# Patient Record
Sex: Male | Born: 1955 | Hispanic: Refuse to answer | Marital: Married | Smoking: Former smoker
Health system: Southern US, Community
[De-identification: ages and names within clinical notes are randomized; demographics above are authoritative.]

## PROBLEM LIST (undated history)

## (undated) DIAGNOSIS — C61 Malignant neoplasm of prostate: Principal | ICD-10-CM

## (undated) DIAGNOSIS — R6882 Decreased libido: Secondary | ICD-10-CM

## (undated) DIAGNOSIS — N529 Male erectile dysfunction, unspecified: Secondary | ICD-10-CM

## (undated) DIAGNOSIS — I82409 Acute embolism and thrombosis of unspecified deep veins of unspecified lower extremity: Secondary | ICD-10-CM

## (undated) DIAGNOSIS — R972 Elevated prostate specific antigen [PSA]: Secondary | ICD-10-CM

## (undated) DIAGNOSIS — E7800 Pure hypercholesterolemia, unspecified: Secondary | ICD-10-CM

## (undated) HISTORY — DX: Male erectile dysfunction, unspecified: N52.9

## (undated) HISTORY — DX: Malignant neoplasm of prostate: C61

## (undated) HISTORY — DX: Decreased libido: R68.82

## (undated) HISTORY — DX: Elevated prostate specific antigen (PSA): R97.20

## (undated) HISTORY — DX: Pure hypercholesterolemia, unspecified: E78.00

---

## 2002-03-24 ENCOUNTER — Encounter: Payer: Self-pay | Admitting: Emergency Medicine

## 2002-03-24 ENCOUNTER — Emergency Department (HOSPITAL_COMMUNITY): Admission: EM | Admit: 2002-03-24 | Discharge: 2002-03-24 | Payer: Self-pay | Admitting: Plastic Surgery

## 2004-05-19 ENCOUNTER — Emergency Department: Payer: Self-pay | Admitting: Unknown Physician Specialty

## 2005-07-12 ENCOUNTER — Ambulatory Visit: Payer: Self-pay | Admitting: Family Medicine

## 2006-06-15 ENCOUNTER — Ambulatory Visit: Payer: Self-pay | Admitting: Family Medicine

## 2008-07-16 DIAGNOSIS — R6882 Decreased libido: Secondary | ICD-10-CM | POA: Insufficient documentation

## 2009-03-11 ENCOUNTER — Emergency Department: Payer: Self-pay | Admitting: Emergency Medicine

## 2010-03-26 DIAGNOSIS — E785 Hyperlipidemia, unspecified: Secondary | ICD-10-CM | POA: Insufficient documentation

## 2011-06-28 HISTORY — PX: HERNIA REPAIR: SHX51

## 2012-03-05 ENCOUNTER — Ambulatory Visit: Payer: Self-pay

## 2012-03-26 ENCOUNTER — Ambulatory Visit: Payer: Self-pay | Admitting: General Surgery

## 2012-06-27 HISTORY — PX: COLONOSCOPY: SHX174

## 2013-06-27 DIAGNOSIS — C61 Malignant neoplasm of prostate: Secondary | ICD-10-CM

## 2013-06-27 HISTORY — DX: Malignant neoplasm of prostate: C61

## 2013-08-26 ENCOUNTER — Ambulatory Visit: Payer: Self-pay | Admitting: Gastroenterology

## 2013-10-17 ENCOUNTER — Ambulatory Visit: Payer: Self-pay | Admitting: Urology

## 2013-12-06 LAB — LIPID PANEL: CHOLESTEROL: 242 mg/dL — AB (ref 0–200)

## 2014-02-19 ENCOUNTER — Other Ambulatory Visit: Payer: Self-pay | Admitting: *Deleted

## 2014-02-19 ENCOUNTER — Ambulatory Visit: Payer: 59 | Admitting: Podiatry

## 2014-02-19 ENCOUNTER — Encounter: Payer: Self-pay | Admitting: Podiatry

## 2014-02-19 ENCOUNTER — Ambulatory Visit (INDEPENDENT_AMBULATORY_CARE_PROVIDER_SITE_OTHER): Payer: 59 | Admitting: Podiatry

## 2014-02-19 ENCOUNTER — Ambulatory Visit (INDEPENDENT_AMBULATORY_CARE_PROVIDER_SITE_OTHER): Payer: 59

## 2014-02-19 VITALS — Ht 68.0 in | Wt 190.0 lb

## 2014-02-19 DIAGNOSIS — M722 Plantar fascial fibromatosis: Secondary | ICD-10-CM

## 2014-02-19 NOTE — Progress Notes (Signed)
   Subjective:    Patient ID: Brandon Diaz, male    DOB: 1955/09/24, 58 y.o.   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 Been having heel pain for about 5 weeks and not getting any better. By the end of the day it swells and real painful.  I am hobbling in the morning. Been keeping my tennis shoes by the bed. I am having a biopsy done this afternoon at 4.   Foot Pain      Review of Systems  Eyes: Positive for redness.  All other systems reviewed and are negative.      Objective:   Physical Exam: I have reviewed his past mental history medications allergies surgeries social history and review of systems. Pulses are strongly palpable right. Neurologic sensorium is intact per Semmes-Weinstein monofilament. Deep tendon reflexes are intact bilateral muscle strength is 5 over 5 dorsiflexors plantar flexors inverters everters all intrinsic musculature is intact. Orthopedic evaluation demonstrates pain on palpation medial continued tubercle of the right heel. Flexible pes planus is also noted bilateral. Radiographic evaluation demonstrates a small plantar distally oriented calcaneal heel spur with soft tissue increase in density at the plantar fascial calcaneal insertion site.        Assessment & Plan:  Assessment: Pes planus a plantar fasciitis right foot.  Plan: I will followup with him in 2 weeks to get him started on an injection plan and oral steroids. He is having a prostate biopsy and should not have steroid therapy at this time. I did dispense a plantar fascial brace and a night splint and discussed appropriate shoe gear stretching exercises ice therapy and shoe gear modification. Followup with him in 2 weeks

## 2014-02-19 NOTE — Patient Instructions (Signed)

## 2014-03-05 ENCOUNTER — Ambulatory Visit: Payer: 59 | Admitting: Podiatry

## 2014-04-07 ENCOUNTER — Ambulatory Visit: Payer: 59 | Admitting: Podiatry

## 2014-08-15 LAB — PSA: PSA: 5.7

## 2014-10-14 NOTE — Op Note (Signed)
PATIENT NAME:  Brandon Diaz, Brandon Diaz MR#:  [id number] DATE OF BIRTH:  01-28-1956  DATE OF PROCEDURE:  03/26/2012  PREOPERATIVE DIAGNOSIS: Umbilical hernia.   POSTOPERATIVE DIAGNOSIS: Umbilical hernia.   OPERATIVE PROCEDURE: Umbilical hernia repair.   SURGEON: Hervey Ard, MD  ANESTHESIA: General by LMA, Marcaine 0.5% plain 30 mL local infiltration.   ESTIMATED BLOOD LOSS: Minimal.   CLINICAL NOTE: This 59 year old male has developed a symptomatic umbilical hernia and was felt to be a candidate for elective repair.   DESCRIPTION OF PROCEDURE: With the patient under adequate general anesthesia, the area was prepped with ChloraPrep and draped. Field block anesthesia was established and well tolerated. An infraumbilical incision was made in the skin and subcutaneous tissue divided sharply followed by dissection with scissors and electrocautery. The umbilical skin was elevated off the hernia sac which was excised and discarded. The fascia was cleared. The undersurface of the fascia was cleared and primary repair with interrupted 0 Surgilon sutures completed. The umbilical skin was tacked to the fascia with 2-0 Vicryl. The adipose layer was approximated with a running 2-0 Vicryl and the skin closed with a running 4-0 Vicryl subcuticular suture. Benzoin, Steri-Strips, Telfa, and Tegaderm dressing was then applied.   The patient tolerated the procedure well and was taken to the recovery room in stable condition. ____________________________ Robert Bellow, MD jwb:slb D: 03/26/2012 21:47:31 ET     T: 03/27/2012 09:40:14 ET        JOB#: [id number] cc: Robert Bellow, MD, <Dictator> Ashok Norris, MD Chayah Mckee Amedeo Kinsman MD ELECTRONICALLY SIGNED 03/28/2012 22:15

## 2014-12-01 ENCOUNTER — Encounter (INDEPENDENT_AMBULATORY_CARE_PROVIDER_SITE_OTHER): Payer: Self-pay

## 2014-12-01 ENCOUNTER — Encounter: Payer: Self-pay | Admitting: Family Medicine

## 2014-12-01 ENCOUNTER — Ambulatory Visit (INDEPENDENT_AMBULATORY_CARE_PROVIDER_SITE_OTHER): Payer: 59 | Admitting: Family Medicine

## 2014-12-01 VITALS — BP 128/74 | HR 74 | Temp 98.4°F | Resp 18 | Ht 67.0 in | Wt 194.0 lb

## 2014-12-01 DIAGNOSIS — C61 Malignant neoplasm of prostate: Secondary | ICD-10-CM

## 2014-12-01 DIAGNOSIS — E785 Hyperlipidemia, unspecified: Secondary | ICD-10-CM

## 2014-12-01 DIAGNOSIS — R972 Elevated prostate specific antigen [PSA]: Secondary | ICD-10-CM | POA: Insufficient documentation

## 2014-12-01 DIAGNOSIS — N529 Male erectile dysfunction, unspecified: Secondary | ICD-10-CM | POA: Diagnosis not present

## 2014-12-01 DIAGNOSIS — R6882 Decreased libido: Secondary | ICD-10-CM | POA: Diagnosis not present

## 2014-12-01 DIAGNOSIS — K429 Umbilical hernia without obstruction or gangrene: Secondary | ICD-10-CM

## 2014-12-01 DIAGNOSIS — Z114 Encounter for screening for human immunodeficiency virus [HIV]: Secondary | ICD-10-CM | POA: Insufficient documentation

## 2014-12-01 HISTORY — DX: Malignant neoplasm of prostate: C61

## 2014-12-01 HISTORY — DX: Male erectile dysfunction, unspecified: N52.9

## 2014-12-01 HISTORY — DX: Elevated prostate specific antigen (PSA): R97.20

## 2014-12-01 MED ORDER — SILDENAFIL CITRATE 100 MG PO TABS: 50.0000 mg | ORAL_TABLET | Freq: Every day | ORAL | Status: DC | PRN

## 2014-12-01 NOTE — Progress Notes (Signed)
Name: Brandon Diaz   MRN: [id number]    DOB: 09/30/1955   Date:12/01/2014       Progress Note  Subjective  Chief Complaint    Hyperlipidemia This is a chronic problem. The current episode started more than 1 year ago. The problem is uncontrolled. Recent lipid tests were reviewed and are high. He has no history of chronic renal disease or obesity. Factors aggravating his hyperlipidemia include fatty foods. Pertinent negatives include no chest pain. Current antihyperlipidemic treatment includes statins. The current treatment provides mild improvement of lipids. Compliance problems: recurrent non-compliance.  Risk factors for coronary artery disease include dyslipidemia, male sex and stress.  Erectile Dysfunction This is a chronic problem. The current episode started more than 1 year ago. The problem is unchanged. The nature of his difficulty is achieving erection, maintaining erection and penetration. He reports no anxiety. Pertinent negatives include no dysuria or genital pain. The symptoms are aggravated by stress. Past treatments include vardenafil. He has been using treatment for 1 to 2 years. He has had no adverse reactions caused by medications. Risk factors: prostate cancer.   UMBILICAL HERNIA  PATIENT HAS A RECURRENCE OF BULGING AT JUST ABOVE AND TO LEFT OF PRIOR UMBILICAL HERNIA SX.  59 yo presenting for annual physical.    Past Medical History  Diagnosis Date  . High cholesterol   . Prostate cancer   . Decreased libido     Past Surgical History  Procedure Laterality Date  . Hernia repair      Family History  Problem Relation Age of Onset  . Stroke Mother   . Cancer Brother     History   Social History  . Marital Status: Married    Spouse Name: N/A  . Number of Children: N/A  . Years of Education: N/A   Occupational History  . Not on file.   Social History Main Topics  . Smoking status: Former Smoker    Quit date: 07/02/1985  . Smokeless tobacco: Never  Used  . Alcohol Use: No  . Drug Use: No  . Sexual Activity: Not on file   Other Topics Concern  . Not on file   Social History Narrative    No current outpatient prescriptions on file.  Allergies  Allergen Reactions  . Atorvastatin   . Ibuprofen Other (See Comments)  . Sudafed [Pseudoephedrine Hcl] Other (See Comments)     Review of Systems  Constitutional: Negative for fever and weight loss.  HENT: Negative.   Eyes: Negative.   Respiratory: Negative.   Cardiovascular: Negative.  Negative for chest pain.  Gastrointestinal: Negative.   Genitourinary: Negative for dysuria.       ED  NOT TAKING MEDS  Skin: Negative for rash.  Neurological: Negative for dizziness.  Endo/Heme/Allergies: Negative.   Psychiatric/Behavioral: Negative for depression and substance abuse. The patient is not nervous/anxious.      Objective  Filed Vitals:   12/01/14 1427  BP: 128/74  Pulse: 74  Temp: 98.4 F (36.9 C)  TempSrc: Oral  Resp: 18  Height: 5' 7"  (1.702 m)  Weight: 194 lb (87.998 kg)  SpO2: 98%    Physical Exam  Constitutional: He is oriented to person, place, and time and well-developed, well-nourished, and in no distress.  HENT:  Head: Normocephalic.  Eyes: Pupils are equal, round, and reactive to light.  Neck: Normal range of motion.  Cardiovascular: Normal rate, regular rhythm and normal heart sounds.   No murmur heard. Pulmonary/Chest: Effort normal and breath  sounds normal.  Musculoskeletal: Normal range of motion.  Neurological: He is alert and oriented to person, place, and time.  Skin: Skin is warm and dry.  Psychiatric: Affect normal.     No results found for this or any previous visit (from the past 2160 hour(s)).   Assessment & Plan  1. Hyperlipemia  - Lipid Profile - Comp Met (CMET) - TSH  2. CA of prostate FOLLOWED BY UROLOGIST    3 Erectile dysfunction, unspecified erectile dysfunction type - sildenafil (VIAGRA) 100 MG tablet; Take  0.5-1 tablets (50-100 mg total) by mouth daily as needed for erectile dysfunction.  Dispense: 5 tablet; Refill: 11  4 Umbilical hernia without obstruction and without gangrene  - Ambulatory referral to General Surgery

## 2014-12-03 ENCOUNTER — Encounter: Payer: Self-pay | Admitting: *Deleted

## 2014-12-12 ENCOUNTER — Encounter: Payer: Self-pay | Admitting: General Surgery

## 2014-12-15 ENCOUNTER — Ambulatory Visit (INDEPENDENT_AMBULATORY_CARE_PROVIDER_SITE_OTHER): Payer: 59 | Admitting: General Surgery

## 2014-12-15 ENCOUNTER — Encounter: Payer: Self-pay | Admitting: General Surgery

## 2014-12-15 VITALS — BP 142/82 | HR 72 | Resp 12 | Ht 65.0 in | Wt 197.0 lb

## 2014-12-15 DIAGNOSIS — Z8719 Personal history of other diseases of the digestive system: Secondary | ICD-10-CM | POA: Insufficient documentation

## 2014-12-15 DIAGNOSIS — Z9889 Other specified postprocedural states: Secondary | ICD-10-CM

## 2014-12-15 NOTE — Patient Instructions (Signed)
Use proper lifting techniques during heavy lifting. Plan to observe area. Patient was encouraged to call us if symptoms get worse.

## 2014-12-15 NOTE — Progress Notes (Signed)
Patient ID: Brandon Diaz, male   DOB: July 03, 1955, 59 y.o.   MRN: [id number]  Chief Complaint  Patient presents with  . Other    umbilical hernia    HPI COURVOISIER Diaz is a 59 y.o. male here today for an evaluation of an umbilical hernia. He had this repaired once before in 2013. He states he first noticed about 6 months ago and during an exam with his primary care doctor on 12/01/14. He states it is sore at times. This first started as a lump. Swelling has increased since his exam with Dr. Rutherford Diaz. He notices the hernia more when he first gets up. He did notice a stinging sensation in the midabdominal area while lifting at work in January 2015. No recurrent symptoms since that time. The patient has been able to do all of his regular routines at work as well as around the house without difficulty. No symptoms of local pain, stinging or pressure with activity.   HPI  Past Medical History  Diagnosis Date  . High cholesterol   . Decreased libido   . Prostate cancer 2015    Dr. Erlene Diaz    Past Surgical History  Procedure Laterality Date  . Hernia repair  9735    umilical   . Colonoscopy  2014    Brandon Diaz    Family History  Problem Relation Age of Onset  . Stroke Mother   . Cancer Brother     pancreatic  . Kidney failure Father     Social History History  Substance Use Topics  . Smoking status: Former Smoker    Quit date: 07/02/1985  . Smokeless tobacco: Never Used  . Alcohol Use: No    Allergies  Allergen Reactions  . Ibuprofen Other (See Comments)  . Sudafed [Pseudoephedrine Hcl] Other (See Comments)    Current Outpatient Prescriptions  Medication Sig Dispense Refill  . aspirin 81 MG tablet Take 81 mg by mouth daily.    . cetirizine (ZYRTEC) 10 MG chewable tablet Chew 10 mg by mouth daily.    . sildenafil (VIAGRA) 100 MG tablet Take 0.5-1 tablets (50-100 mg total) by mouth daily as needed for erectile dysfunction. 5 tablet 11   No current facility-administered  medications for this visit.    Review of Systems Review of Systems  Constitutional: Negative.   Respiratory: Negative.   Cardiovascular: Negative.     Blood pressure 142/82, pulse 72, resp. rate 12, height 5\' 5"  (1.651 m), weight 197 lb (89.359 kg).  Physical Exam Physical Exam  Constitutional: He is oriented to person, place, and time. He appears well-developed and well-nourished.  HENT:  Mouth/Throat: Oropharynx is clear and moist.  Eyes: Conjunctivae are normal. No scleral icterus.  Neck: Neck supple.  Cardiovascular: Normal rate, regular rhythm and normal heart sounds.   Pulmonary/Chest: Effort normal and breath sounds normal.  Abdominal: Soft. Bowel sounds are normal.    Lymphadenopathy:    He has no cervical adenopathy.  Neurological: He is alert and oriented to person, place, and time.  Skin: Skin is warm and dry.    Data Reviewed 2013 operative report. Primary repair.  Assessment    Visual prominence without clear evidence of recurrent umbilical or new epigastric hernia.    Plan    The patient is asymptomatic at present, significantly different from his presentation in 3299 with his umbilical hernia. Without a clearly defined fascial defect, I would recommend observation at this time. The patient is well versed on proper lifting techniques.  Will plan for reassessment in 6 months, earlier if he develops symptoms.    Use proper lifting techniques during heavy lifting. Plan to observe area. Patient was encouraged to call us if symptoms get worse.   IBB:[id number]   Robert Bellow 12/15/2014, 1:47 PM

## 2014-12-16 LAB — COMPREHENSIVE METABOLIC PANEL
ALBUMIN: 4.2 g/dL (ref 3.5–5.5)
ALT: 39 IU/L (ref 0–44)
AST: 33 IU/L (ref 0–40)
Albumin/Globulin Ratio: 1.6 (ref 1.1–2.5)
Alkaline Phosphatase: 57 IU/L (ref 39–117)
BUN/Creatinine Ratio: 19 (ref 9–20)
BUN: 18 mg/dL (ref 6–24)
Bilirubin Total: 0.4 mg/dL (ref 0.0–1.2)
CALCIUM: 8.9 mg/dL (ref 8.7–10.2)
CO2: 23 mmol/L (ref 18–29)
Chloride: 101 mmol/L (ref 97–108)
Creatinine, Ser: 0.93 mg/dL (ref 0.76–1.27)
GFR, EST AFRICAN AMERICAN: 105 mL/min/{1.73_m2} (ref >59)
GFR, EST NON AFRICAN AMERICAN: 91 mL/min/{1.73_m2} (ref >59)
GLOBULIN, TOTAL: 2.6 g/dL (ref 1.5–4.5)
GLUCOSE: 95 mg/dL (ref 65–99)
Potassium: 4.6 mmol/L (ref 3.5–5.2)
Sodium: 139 mmol/L (ref 134–144)
TOTAL PROTEIN: 6.8 g/dL (ref 6.0–8.5)

## 2014-12-16 LAB — LIPID PANEL
CHOL/HDL RATIO: 4.9 ratio (ref 0.0–5.0)
Cholesterol, Total: 246 mg/dL — ABNORMAL HIGH (ref 100–199)
HDL: 50 mg/dL (ref >39)
LDL Calculated: 182 mg/dL — ABNORMAL HIGH (ref 0–99)
Triglycerides: 71 mg/dL (ref 0–149)
VLDL Cholesterol Cal: 14 mg/dL (ref 5–40)

## 2014-12-16 LAB — TSH: TSH: 3.41 u[IU]/mL (ref 0.450–4.500)

## 2014-12-18 ENCOUNTER — Telehealth: Payer: Self-pay | Admitting: Family Medicine

## 2014-12-18 NOTE — Telephone Encounter (Signed)
PT STATED THAT HE WAS RETURNING YOUR CALL

## 2014-12-26 NOTE — Telephone Encounter (Signed)
Patient called back regarding lab results. He was taking 20 mg of Atorvastatin and it gave him leg cramps. He is willing to try a lower dose of Atorvastatin to help bring his cholesterol numbers down. If he have any problems he will call office back . Please send a new script to [facility] for Atorvastatin 20 mg.

## 2015-01-01 NOTE — Telephone Encounter (Signed)
ok 

## 2015-01-30 ENCOUNTER — Other Ambulatory Visit: Payer: Self-pay

## 2015-02-06 ENCOUNTER — Ambulatory Visit (INDEPENDENT_AMBULATORY_CARE_PROVIDER_SITE_OTHER): Payer: 59 | Admitting: Urology

## 2015-02-06 ENCOUNTER — Encounter: Payer: Self-pay | Admitting: Urology

## 2015-02-06 VITALS — BP 135/80 | HR 66 | Ht 67.0 in | Wt 192.8 lb

## 2015-02-06 DIAGNOSIS — C61 Malignant neoplasm of prostate: Secondary | ICD-10-CM

## 2015-02-06 NOTE — Progress Notes (Signed)
02/06/2015 4:43 PM   Brandon Diaz [birth year] [id number]  Referring provider: Ashok Norris, MD [address] [place], [place] [address]  Chief Complaint  Patient presents with  . Follow-up    3 month    HPI:  59 yo M with rising PSA to 3.1 (from 2.6) ng/ dL s/p prostate biopsy on 04/28/14. His initial pathology results showed Gleason 3+3 prostate cancer in 3/12 cores, 2 on left (lateral base/ lateral mid) and right apex involving 6-27% of each core. DRE unremarkable (cT1c), TRUS vol 37 cc.   He underwent routine confirmatory biopsy on 08/2014 which showed Gleason 3+3 in 2/12 cores bilateral involving only 1% of each core.  He also has a strong family history of prostate cancer, brother dx and treated around his current same age who underwent radical prostatectomy.  No voiding symptoms. IPSS 4/25, bother 0.  No history of ED. SHIM score 25.  His most recent PSA 5.7 in 2/016.   He returns today for DRE/ PSA.     PMH: Past Medical History  Diagnosis Date  . High cholesterol   . Decreased libido   . Prostate cancer 2015    Dr. Erlene Quan    Surgical History: Past Surgical History  Procedure Laterality Date  . Hernia repair  7253    umilical   . Colonoscopy  2014    [facility]    Home Medications:    Medication List       This list is accurate as of: 02/06/15  4:43 PM.  Always use your most recent med list.               aspirin 81 MG tablet  Take 81 mg by mouth daily.     cetirizine 10 MG chewable tablet  Commonly known as:  ZYRTEC  Chew 10 mg by mouth daily.     sildenafil 100 MG tablet  Commonly known as:  VIAGRA  Take 0.5-1 tablets (50-100 mg total) by mouth daily as needed for erectile dysfunction.        Allergies:  Allergies  Allergen Reactions  . Ibuprofen Other (See Comments)  . Sudafed [Pseudoephedrine Hcl] Other (See Comments)    Family History: Family History  Problem Relation Age of Onset  . Stroke Mother   .  Cancer Brother     pancreatic  . Kidney failure Father   . Prostate cancer Brother     Social History:  reports that he quit smoking about 29 years ago. He has never used smokeless tobacco. He reports that he does not drink alcohol or use illicit drugs.  ROS: UROLOGY Frequent Urination?: No Hard to postpone urination?: No Burning/pain with urination?: No Get up at night to urinate?: No Leakage of urine?: No Urine stream starts and stops?: No Trouble starting stream?: No Do you have to strain to urinate?: No Blood in urine?: No Urinary tract infection?: No Sexually transmitted disease?: No Injury to kidneys or bladder?: No Painful intercourse?: No Weak stream?: No Erection problems?: Yes Penile pain?: No  Gastrointestinal Nausea?: No Vomiting?: No Indigestion/heartburn?: No Diarrhea?: No Constipation?: No  Constitutional Fever: No Night sweats?: No Weight loss?: No Fatigue?: No  Skin Skin rash/lesions?: No Itching?: No  Eyes Blurred vision?: No Double vision?: No  Ears/Nose/Throat Sore throat?: No Sinus problems?: Yes  Hematologic/Lymphatic Swollen glands?: No Easy bruising?: No  Cardiovascular Leg swelling?: No Chest pain?: No  Respiratory Cough?: No Shortness of breath?: No  Endocrine Excessive thirst?: No  Musculoskeletal  Back pain?: No Joint pain?: No  Neurological Headaches?: No Dizziness?: No  Psychologic Depression?: No Anxiety?: No  Physical Exam: BP 135/80 mmHg  Pulse 66  Ht 5\' 7"  (1.702 m)  Wt 192 lb 12.8 oz (87.454 kg)  BMI 30.19 kg/m2  Constitutional:  Alert and oriented, No acute distress. HEENT: Brown AT, moist mucus membranes.  Trachea midline, no masses. Cardiovascular: No clubbing, cyanosis, or edema. Respiratory: Normal respiratory effort, no increased work of breathing. GI: Abdomen is soft, nontender, nondistended, no abdominal masses GU: No CVA tenderness. DRE today unremarkable, 40 g rubbery, no nodules.   Normal sphincter tone.   Skin: No rashes, bruises or suspicious lesions. Lymph: No cervical or inguinal adenopathy. Neurologic: Grossly intact, no focal deficits, moving all 4 extremities. Psychiatric: Normal mood and affect.  Laboratory Data: No results found for: WBC, HGB, HCT, MCV, PLT  Lab Results  Component Value Date   CREATININE 0.93 12/15/2014    Lab Results  Component Value Date   PSA 5.7 08/15/2014    Assessment & Plan:   59 yo AAM dx cT1c Gleason 3+3 prostate cancer in 3/13 cores, iPSA 3.1 with family history of prostate cancer, DRE unremarkable, TRUS vol 37 g. Dx 04/2014.    Repeat biopsy 08/2014, Gleason 3+3 in 2/12 cores, only 1% each core.  1. Prostate cancer Will contact with PSA results. Plan for repeat PSA/DRE in 4-5 months.  If PSA remains stable, can spread out to q6 months.   - PSA  Return in about 4 months (around 06/08/2015) for PSA/ DRE.  Hollice Espy, MD  [facility] [address], [place], [place] [address] [phone]

## 2015-02-07 LAB — PSA: Prostate Specific Ag, Serum: 3.5 ng/mL (ref 0.0–4.0)

## 2015-05-30 ENCOUNTER — Emergency Department
Admission: EM | Admit: 2015-05-30 | Discharge: 2015-05-30 | Disposition: A | Discharge status: Home / Self Care | Payer: 59 | Attending: Emergency Medicine | Admitting: Emergency Medicine

## 2015-05-30 ENCOUNTER — Emergency Department: Payer: 59

## 2015-05-30 ENCOUNTER — Encounter: Payer: Self-pay | Admitting: Emergency Medicine

## 2015-05-30 DIAGNOSIS — Y9389 Activity, other specified: Secondary | ICD-10-CM | POA: Insufficient documentation

## 2015-05-30 DIAGNOSIS — Z87891 Personal history of nicotine dependence: Secondary | ICD-10-CM | POA: Diagnosis not present

## 2015-05-30 DIAGNOSIS — Y998 Other external cause status: Secondary | ICD-10-CM | POA: Diagnosis not present

## 2015-05-30 DIAGNOSIS — Y9289 Other specified places as the place of occurrence of the external cause: Secondary | ICD-10-CM | POA: Insufficient documentation

## 2015-05-30 DIAGNOSIS — Z7982 Long term (current) use of aspirin: Secondary | ICD-10-CM | POA: Insufficient documentation

## 2015-05-30 DIAGNOSIS — W132XXA Fall from, out of or through roof, initial encounter: Secondary | ICD-10-CM | POA: Insufficient documentation

## 2015-05-30 DIAGNOSIS — S50311A Abrasion of right elbow, initial encounter: Secondary | ICD-10-CM | POA: Diagnosis not present

## 2015-05-30 DIAGNOSIS — S30811A Abrasion of abdominal wall, initial encounter: Secondary | ICD-10-CM | POA: Insufficient documentation

## 2015-05-30 DIAGNOSIS — M21829 Other specified acquired deformities of unspecified upper arm: Secondary | ICD-10-CM

## 2015-05-30 DIAGNOSIS — Z79899 Other long term (current) drug therapy: Secondary | ICD-10-CM | POA: Diagnosis not present

## 2015-05-30 DIAGNOSIS — S42291A Other displaced fracture of upper end of right humerus, initial encounter for closed fracture: Secondary | ICD-10-CM | POA: Insufficient documentation

## 2015-05-30 DIAGNOSIS — W19XXXA Unspecified fall, initial encounter: Secondary | ICD-10-CM

## 2015-05-30 DIAGNOSIS — S4992XA Unspecified injury of left shoulder and upper arm, initial encounter: Secondary | ICD-10-CM | POA: Diagnosis present

## 2015-05-30 NOTE — ED Provider Notes (Signed)
[facility] Emergency Department Provider Note  ____________________________________________  Time seen: Approximately 5 PM  I have reviewed the triage vital signs and the nursing notes.   HISTORY  Chief Complaint Fall and Arm Pain    HPI Brandon Diaz is a 59 y.o. 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 He says he thinks he stepped on a week port and fell about 8-10 feet and landed on his left side. He denies hitting his head or any loss of consciousness. He is not reporting pain to his left proximal humerus and says it is difficult to move his arm on that side. He says when he does move his left arm that the pain shoots from his left humerus up into his left back along the top of the scapula. He denies any chest pain or abdominal pain. Denies any pain in his lower extremities.    Past Medical History  Diagnosis Date  . High cholesterol   . Decreased libido   . Prostate cancer [facility]) 2015    Dr. Erlene Quan    Patient Active Problem List   Diagnosis Date Noted  . History of umbilical hernia repair [id number]  . Abnormal prostate specific antigen 12/01/2014  . CA of prostate ([place]) 12/01/2014  . Screening for human immunodeficiency virus 12/01/2014  . Erectile dysfunction 12/01/2014  . HLD (hyperlipidemia) 03/26/2010  . Decreased libido 07/16/2008    Past Surgical History  Procedure Laterality Date  . Hernia repair  0000000    umilical   . Colonoscopy  2014    [facility]    Current Outpatient Rx  Name  Route  Sig  Dispense  Refill  . aspirin 325 MG EC tablet   Oral   Take 325 mg by mouth daily.         Marland Kitchen aspirin 81 MG tablet   Oral   Take 81 mg by mouth daily.         . cetirizine (ZYRTEC) 10 MG chewable tablet   Oral   Chew 10 mg by mouth daily.         . sildenafil (VIAGRA) 100 MG tablet   Oral   Take 0.5-1 tablets (50-100 mg total) by mouth daily as needed for erectile  dysfunction. Patient not taking: Reported on 02/06/2015   5 tablet   11     Allergies Ibuprofen and Sudafed  Family History  Problem Relation Age of Onset  . Stroke Mother   . Cancer Brother     pancreatic  . Kidney failure Father   . Prostate cancer Brother     Social History Social History  Substance Use Topics  . Smoking status: Former Smoker    Quit date: 07/02/1985  . Smokeless tobacco: Never Used  . Alcohol Use: No    Review of Systems Constitutional: No fever/chills Eyes: No visual changes. ENT: No sore throat. Cardiovascular: Denies chest pain. Respiratory: Denies shortness of breath. Gastrointestinal: No abdominal pain.  No nausea, no vomiting.  No diarrhea.  No constipation. Genitourinary: Negative for dysuria. Musculoskeletal: Negative for back pain. Skin: Negative for rash. Neurological: Negative for headaches, focal weakness or numbness.  10-point ROS otherwise negative.  ____________________________________________   PHYSICAL EXAM:  VITAL SIGNS: ED Triage Vitals  Enc Vitals Group     BP 05/30/15 1626 152/83 mmHg     Pulse Rate 05/30/15 1626 92     Resp --      Temp  05/30/15 1626 97.7 F (36.5 C)     Temp Source 05/30/15 1626 Oral     SpO2 05/30/15 1626 100 %     Weight 05/30/15 1626 192 lb (87.091 kg)     Height 05/30/15 1626 5\' 7"  (1.702 m)     Head Cir --      Peak Flow --      Pain Score 05/30/15 1629 10     Pain Loc --      Pain Edu? --      Excl. in [place]? --     Constitutional: Alert and oriented. Well appearing and in no acute distress. Eyes: Conjunctivae are normal. PERRL. EOMI. Head: Atraumatic. Nose: No congestion/rhinnorhea. Mouth/Throat: Mucous membranes are moist.  Oropharynx non-erythematous. Neck: No stridor.   Cardiovascular: Normal rate, regular rhythm. Grossly normal heart sounds.  Good peripheral circulation. Respiratory: Normal respiratory effort.  No retractions. Lungs CTAB. Gastrointestinal: Soft and nontender.  No distention. No abdominal bruits. No CVA tenderness. Musculoskeletal: No lower extremity tenderness nor edema.  No joint effusions. Tenderness to the proximal lateral left humerus but sensate over that area. He is able to move his left elbow but unable to move his left shoulder due to pain. He is neurovascularly intact distal to the left proximal humerus with equal and bilateral radial pulses as well as full strength to grip to the bilateral hands. Neurologic:  Normal speech and language. No gross focal neurologic deficits are appreciated. No gait instability. Skin:  2 x 6 cm superficial abrasion to the right cubital fossa. There is no bleeding. There is also a 1 x 2 cm abrasion to the left side of his abdomen. No ecchymosis to the abdomen or chest. No crepitus or tenderness palpation over the thorax Psychiatric: Mood and affect are normal. Speech and behavior are normal.  ____________________________________________   LABS (all labs ordered are listed, but only abnormal results are displayed)  Labs Reviewed - No data to display ____________________________________________  EKG   ____________________________________________  RADIOLOGY  Shoulder x-ray with no dislocation or definite acute osseous abnormality. Humerus film with possible Hill-Sachs deformity of the humeral head. Chest x-ray without any acute cardiopulmonary process. ____________________________________________   PROCEDURES   ____________________________________________   INITIAL IMPRESSION / ASSESSMENT AND PLAN / ED COURSE  Pertinent labs & imaging results that were available during my care of the patient were reviewed by me and considered in my medical decision making (see chart for details).  ----------------------------------------- 7:30 PM on 05/30/2015 -----------------------------------------  Patient does have a mildly amount of improvement ability to left arm but still a lot of pain to the proximal  lateral humerus. I discussed with him as well as his wife the x-ray results. They understand that a possible deformity of the humerus was found close worries having most of his pain. Placement a shoulder immobilizer which she agrees to keep on until he is seen by orthopedic doctor. He understands the plan is going to comply. I also offered him medications for pain and he is refusing at this time. He continues to deny any chest, head neck or abdominal pain. He'll be discharged to home. It is possible that he dislocated and then reduce his arm spontaneously during this fall causing Hill-Sachs deformity. ____________________________________________   FINAL CLINICAL IMPRESSION(S) / ED DIAGNOSES  Left shoulder pain with Hill-Sachs deformity.    Orbie Pyo, MD 05/30/15 [phone]

## 2015-05-30 NOTE — ED Notes (Signed)
Md Notified of potential trauma.

## 2015-05-30 NOTE — ED Notes (Signed)
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 Patient denies any other pain that pain at left shoulder blade and left upper arm. However, patient has abrasions present to abdomen. Wife states she found patient laying flat on his back. Patient ambulatory to stat desk.

## 2015-06-16 ENCOUNTER — Ambulatory Visit: Payer: Self-pay | Admitting: General Surgery

## 2015-06-23 ENCOUNTER — Other Ambulatory Visit: Payer: Self-pay

## 2015-06-23 ENCOUNTER — Other Ambulatory Visit: Payer: Self-pay | Admitting: Family Medicine

## 2015-06-23 DIAGNOSIS — E785 Hyperlipidemia, unspecified: Secondary | ICD-10-CM

## 2015-06-23 DIAGNOSIS — I10 Essential (primary) hypertension: Secondary | ICD-10-CM

## 2015-06-23 DIAGNOSIS — Z0000 Encounter for general adult medical examination without abnormal findings: Secondary | ICD-10-CM

## 2015-06-24 LAB — LIPID PANEL
CHOLESTEROL TOTAL: 199 mg/dL (ref 100–199)
Chol/HDL Ratio: 3.9 ratio units (ref 0.0–5.0)
HDL: 51 mg/dL (ref >39)
LDL CALC: 125 mg/dL — AB (ref 0–99)
Triglycerides: 115 mg/dL (ref 0–149)
VLDL CHOLESTEROL CAL: 23 mg/dL (ref 5–40)

## 2015-06-24 LAB — COMPREHENSIVE METABOLIC PANEL
ALK PHOS: 59 IU/L (ref 39–117)
ALT: 28 IU/L (ref 0–44)
AST: 25 IU/L (ref 0–40)
Albumin/Globulin Ratio: 1.6 (ref 1.1–2.5)
Albumin: 4.3 g/dL (ref 3.5–5.5)
BUN/Creatinine Ratio: 14 (ref 9–20)
BUN: 14 mg/dL (ref 6–24)
Bilirubin Total: 0.6 mg/dL (ref 0.0–1.2)
CALCIUM: 9.4 mg/dL (ref 8.7–10.2)
CO2: 24 mmol/L (ref 18–29)
CREATININE: 0.99 mg/dL (ref 0.76–1.27)
Chloride: 99 mmol/L (ref 96–106)
GFR calc Af Amer: 97 mL/min/{1.73_m2} (ref >59)
GFR calc non Af Amer: 84 mL/min/{1.73_m2} (ref >59)
GLOBULIN, TOTAL: 2.7 g/dL (ref 1.5–4.5)
GLUCOSE: 87 mg/dL (ref 65–99)
POTASSIUM: 4.4 mmol/L (ref 3.5–5.2)
SODIUM: 141 mmol/L (ref 134–144)
Total Protein: 7 g/dL (ref 6.0–8.5)

## 2015-06-24 LAB — CBC
HEMOGLOBIN: 15.5 g/dL (ref 12.6–17.7)
Hematocrit: 46.5 % (ref 37.5–51.0)
MCH: 28.9 pg (ref 26.6–33.0)
MCHC: 33.3 g/dL (ref 31.5–35.7)
MCV: 87 fL (ref 79–97)
PLATELETS: 188 10*3/uL (ref 150–379)
RBC: 5.37 x10E6/uL (ref 4.14–5.80)
RDW: 13.5 % (ref 12.3–15.4)
WBC: 3.8 10*3/uL (ref 3.4–10.8)

## 2015-06-24 LAB — TSH: TSH: 4.31 u[IU]/mL (ref 0.450–4.500)

## 2015-06-25 ENCOUNTER — Encounter: Payer: 59 | Admitting: Family Medicine

## 2015-06-30 ENCOUNTER — Telehealth: Payer: Self-pay | Admitting: Emergency Medicine

## 2015-06-30 ENCOUNTER — Encounter: Payer: Self-pay | Admitting: Family Medicine

## 2015-06-30 ENCOUNTER — Ambulatory Visit (INDEPENDENT_AMBULATORY_CARE_PROVIDER_SITE_OTHER): Payer: 59 | Admitting: Family Medicine

## 2015-06-30 VITALS — BP 130/80 | HR 80 | Temp 97.7°F | Resp 16 | Ht 67.0 in | Wt 194.6 lb

## 2015-06-30 DIAGNOSIS — Z0000 Encounter for general adult medical examination without abnormal findings: Secondary | ICD-10-CM | POA: Diagnosis not present

## 2015-06-30 DIAGNOSIS — R972 Elevated prostate specific antigen [PSA]: Secondary | ICD-10-CM | POA: Diagnosis not present

## 2015-06-30 MED ORDER — ATORVASTATIN CALCIUM 20 MG PO TABS: 20.0000 mg | ORAL_TABLET | Freq: Every day | ORAL | Status: DC

## 2015-06-30 NOTE — Telephone Encounter (Signed)
Patient notified of labs at appointment

## 2015-06-30 NOTE — Progress Notes (Signed)
Name: Brandon Diaz   MRN: [id number]    DOB: 1955-11-18   Date:06/30/2015       Progress Note  Subjective  Chief Complaint  Chief Complaint  Patient presents with  . Annual Exam    HPI  60 year old male presenting for annual H&P. Baseline problems are stable   Past Medical History  Diagnosis Date  . High cholesterol   . Decreased libido   . Prostate cancer [facility]) 2015    Dr. Erlene Quan    Social History  Substance Use Topics  . Smoking status: Former Smoker    Quit date: 07/02/1985  . Smokeless tobacco: Never Used  . Alcohol Use: No     Current outpatient prescriptions:  .  aspirin 325 MG EC tablet, Take 325 mg by mouth daily., Disp: , Rfl:  .  aspirin 81 MG tablet, Take 81 mg by mouth daily., Disp: , Rfl:  .  cetirizine (ZYRTEC) 10 MG chewable tablet, Chew 10 mg by mouth daily., Disp: , Rfl:  .  sildenafil (VIAGRA) 100 MG tablet, Take 0.5-1 tablets (50-100 mg total) by mouth daily as needed for erectile dysfunction. (Patient not taking: Reported on 02/06/2015), Disp: 5 tablet, Rfl: 11  Allergies  Allergen Reactions  . Ibuprofen Other (See Comments)  . Sudafed [Pseudoephedrine Hcl] Other (See Comments)    Review of Systems  Constitutional: Negative for fever, chills and weight loss.  HENT: Negative for congestion, hearing loss, sore throat and tinnitus.   Eyes: Negative for blurred vision, double vision and redness.  Respiratory: Negative for cough, hemoptysis and shortness of breath.   Cardiovascular: Negative for chest pain, palpitations, orthopnea, claudication and leg swelling.  Gastrointestinal: Negative for heartburn, nausea, vomiting, diarrhea, constipation and blood in stool.  Genitourinary: Negative for dysuria, urgency, frequency and hematuria.       Prostate cancer followed by urologist  Musculoskeletal: Negative for myalgias, back pain, joint pain, falls and neck pain.  Skin: Negative for itching.  Neurological: Negative for dizziness, tingling,  tremors, focal weakness, seizures, loss of consciousness, weakness and headaches.  Endo/Heme/Allergies: Does not bruise/bleed easily.  Psychiatric/Behavioral: Negative for depression and substance abuse. The patient is not nervous/anxious and does not have insomnia.      Objective  Filed Vitals:   06/30/15 1303  BP: 130/80  Pulse: 80  Temp: 97.7 F (36.5 C)  TempSrc: Oral  Resp: 16  Height: 5\' 7"  (1.702 m)  Weight: 194 lb 9.6 oz (88.27 kg)  SpO2: 97%     Physical Exam  Constitutional: He is oriented to person, place, and time and well-developed, well-nourished, and in no distress.  HENT:  Head: Normocephalic.  Eyes: EOM are normal. Pupils are equal, round, and reactive to light.  Neck: Normal range of motion. Neck supple. No thyromegaly present.  Cardiovascular: Normal rate, regular rhythm and normal heart sounds.   No murmur heard. Pulmonary/Chest: Effort normal and breath sounds normal. No respiratory distress. He has no wheezes.  Abdominal: Soft. Bowel sounds are normal.  Genitourinary:  Followed by urologist  Musculoskeletal: Normal range of motion. He exhibits no edema.  Lymphadenopathy:    He has no cervical adenopathy.  Neurological: He is alert and oriented to person, place, and time. No cranial nerve deficit. Gait normal. Coordination normal.  Skin: Skin is warm and dry. No rash noted.  Psychiatric: Affect and judgment normal.      Assessment & Plan  1. Annual physical exam Labs which were done prior were reviewed and patient given a  copy  2. Abnormal prostate specific antigen Per urologist

## 2015-07-17 ENCOUNTER — Encounter: Payer: Self-pay | Admitting: Urology

## 2015-07-17 ENCOUNTER — Ambulatory Visit (INDEPENDENT_AMBULATORY_CARE_PROVIDER_SITE_OTHER): Payer: 59 | Admitting: Urology

## 2015-07-17 VITALS — BP 131/72 | HR 91 | Ht 67.0 in | Wt 190.2 lb

## 2015-07-17 DIAGNOSIS — C61 Malignant neoplasm of prostate: Secondary | ICD-10-CM | POA: Diagnosis not present

## 2015-07-17 NOTE — Progress Notes (Signed)
2:09 PM  07/17/2015  JABRIAN DEMATTIA [birth year] [id number]  Referring provider: Ashok Norris, MD [address] [place], [place] [address]  Chief Complaint  Patient presents with  . Follow-up    prostate cancer     HPI:  60 yo M with rising PSA to 3.1 (from 2.6) ng/ dL s/p prostate biopsy on 04/28/14. His initial pathology results showed Gleason 3+3 prostate cancer in 3/12 cores, 2 on left (lateral base/ lateral mid) and right apex involving 6-27% of each core. DRE unremarkable (cT1c), TRUS vol 37 cc.   He underwent routine confirmatory biopsy on 08/2014 which showed Gleason 3+3 in 2/12 cores bilateral involving only 1% of each core.  He also has a strong family history of prostate cancer, brother dx and treated around his current same age who underwent radical prostatectomy.  No voiding symptoms.  No history of ED.  His most recent PSA 3.5 in 02/06/2016 down from 5.7 in 2/016.   He returns today for DRE/ PSA.     PMH: Past Medical History  Diagnosis Date  . High cholesterol   . Decreased libido   . Prostate cancer [facility]) 2015    Dr. Erlene Quan  . CA of prostate ([place]) 12/01/2014    followed by urologist   . Erectile dysfunction 12/01/2014  . Abnormal prostate specific antigen 12/01/2014    Surgical History: Past Surgical History  Procedure Laterality Date  . Hernia repair  0000000    umilical   . Colonoscopy  2014    [facility]    Home Medications:    Medication List       This list is accurate as of: 07/17/15  2:09 PM.  Always use your most recent med list.               aspirin 325 MG EC tablet  Take 325 mg by mouth daily.     atorvastatin 20 MG tablet  Commonly known as:  LIPITOR  Take 1 tablet (20 mg total) by mouth daily.     cetirizine 10 MG chewable tablet  Commonly known as:  ZYRTEC  Chew 10 mg by mouth daily. Reported on 07/17/2015     sildenafil 100 MG tablet  Commonly known as:  VIAGRA  Take 0.5-1 tablets (50-100 mg total) by mouth  daily as needed for erectile dysfunction.        Allergies:  Allergies  Allergen Reactions  . Ibuprofen Other (See Comments)  . Sudafed [Pseudoephedrine Hcl] Other (See Comments)    Family History: Family History  Problem Relation Age of Onset  . Stroke Mother   . Cancer Brother     pancreatic  . Kidney failure Father   . Prostate cancer Brother     Social History:  reports that he quit smoking about 30 years ago. He has never used smokeless tobacco. He reports that he does not drink alcohol or use illicit drugs.  ROS: UROLOGY Frequent Urination?: No Hard to postpone urination?: No Burning/pain with urination?: No Get up at night to urinate?: No Leakage of urine?: No Urine stream starts and stops?: No Trouble starting stream?: No Do you have to strain to urinate?: No Blood in urine?: No Urinary tract infection?: No Sexually transmitted disease?: No Injury to kidneys or bladder?: No Painful intercourse?: No Weak stream?: No Erection problems?: No Penile pain?: No  Gastrointestinal Nausea?: No Vomiting?: No Indigestion/heartburn?: No Diarrhea?: No Constipation?: No  Constitutional Fever: No Night sweats?: No Weight loss?: No Fatigue?:  No  Skin Skin rash/lesions?: No Itching?: No  Eyes Blurred vision?: No Double vision?: No  Ears/Nose/Throat Sore throat?: No Sinus problems?: No  Hematologic/Lymphatic Swollen glands?: No Easy bruising?: No  Cardiovascular Chest pain?: No  Respiratory Cough?: No Shortness of breath?: No  Endocrine Excessive thirst?: No  Musculoskeletal Back pain?: No Joint pain?: No  Neurological Headaches?: No Dizziness?: No  Psychologic Depression?: No Anxiety?: No  Physical Exam: BP 131/72 mmHg  Pulse 91  Ht 5\' 7"  (1.702 m)  Wt 190 lb 3.2 oz (86.274 kg)  BMI 29.78 kg/m2  Constitutional:  Alert and oriented, No acute distress. HEENT: [place] AT, moist mucus membranes.  Trachea midline, no  masses. Cardiovascular: No clubbing, cyanosis, or edema. Respiratory: Normal respiratory effort, no increased work of breathing. GI: Abdomen is soft, nontender, nondistended, no abdominal masses GU: No CVA tenderness. DRE today unremarkable, 40 g rubbery, no nodules.  Normal sphincter tone.   Skin: No rashes, bruises or suspicious lesions. Neurologic: Grossly intact, no focal deficits, moving all 4 extremities. Psychiatric: Normal mood and affect.  Laboratory Data: Lab Results  Component Value Date   WBC 3.8 06/23/2015   HCT 46.5 06/23/2015   MCV 87 06/23/2015   PLT 188 06/23/2015    Lab Results  Component Value Date   CREATININE 0.99 06/23/2015     Lab Results  Component Value Date   PSA 5.7 08/15/2014   PSA 3.5 in 02/06/15  Assessment & Plan:   60 yo AAM dx cT1c Gleason 3+3 prostate cancer in 3/13 cores, iPSA 3.1 with family history of prostate cancer, DRE unremarkable, TRUS vol 37 g. Dx 04/2014.    Repeat biopsy 08/2014, Gleason 3+3 in 2/12 cores, only 1% each core.  1. Prostate cancer Will contact with PSA results. Plan for repeat PSA/DRE in 6 months since he has been stable since dx with reassuring repeat confirmatory biopsy.   - PSA  Return in about 6 months (around 01/14/2016) for PSA/ DRE.  Hollice Espy, MD  [facility] [address] [place], [place] [address] [phone]

## 2015-07-18 LAB — PSA: Prostate Specific Ag, Serum: 3.3 ng/mL (ref 0.0–4.0)

## 2015-07-20 ENCOUNTER — Telehealth: Payer: Self-pay

## 2015-07-20 NOTE — Telephone Encounter (Signed)
LMOM-labs have decreased and will need a f/u in 60mo

## 2015-07-20 NOTE — Telephone Encounter (Signed)
-----   Message from Hollice Espy, MD sent at 07/19/2015 12:12 PM EST ----- PSA is 3.3 which is stable from previous value of 3.5! Plan for follow up in 6 months with PSA/ DRE.    Hollice Espy, MD

## 2015-09-23 ENCOUNTER — Encounter: Payer: Self-pay | Admitting: *Deleted

## 2015-12-15 ENCOUNTER — Encounter: Payer: Self-pay | Admitting: *Deleted

## 2015-12-21 ENCOUNTER — Ambulatory Visit (INDEPENDENT_AMBULATORY_CARE_PROVIDER_SITE_OTHER): Payer: 59 | Admitting: General Surgery

## 2015-12-21 ENCOUNTER — Encounter: Payer: Self-pay | Admitting: General Surgery

## 2015-12-21 VITALS — BP 148/86 | HR 70 | Resp 16 | Ht 67.0 in | Wt 191.0 lb

## 2015-12-21 DIAGNOSIS — Z9889 Other specified postprocedural states: Secondary | ICD-10-CM

## 2015-12-21 DIAGNOSIS — Z8719 Personal history of other diseases of the digestive system: Secondary | ICD-10-CM

## 2015-12-21 NOTE — Patient Instructions (Signed)
Follow up as needed

## 2015-12-21 NOTE — Progress Notes (Signed)
Patient ID: Brandon Diaz, male   DOB: March 01, 1956, 60 y.o.   MRN: [id number]  Chief Complaint  Patient presents with  . Follow-up  . Hernia    HPI Brandon Diaz is a 60 y.o. male.  Here today for his follow up umbilical hernia. He had this repaired once before in 2013. He states he has noticed a little more some swelling at the umbilical area. The patient has been able to do all of his regular routines at work as well as around the house without difficulty. He is concerned and wants to know if he needs surgery on the hernia.  Bowels move daily, no bleeding.   He has been followed for early stage prostate cancer by Dr Erlene Quan. His last prostate biopsy in March 2016 was good.  I personally reviewed the patient's history.  HPI  Past Medical History  Diagnosis Date  . High cholesterol   . Decreased libido   . Prostate cancer [facility]) 2015    Dr. Erlene Quan  . CA of prostate ([place]) 12/01/2014    followed by urologist   . Erectile dysfunction 12/01/2014  . Abnormal prostate specific antigen 12/01/2014    Past Surgical History  Procedure Laterality Date  . Hernia repair  0000000    umilical   . Colonoscopy  2014    [place]    Family History  Problem Relation Age of Onset  . Stroke Mother   . Cancer Brother     pancreatic  . Kidney failure Father   . Prostate cancer Brother     Social History Social History  Substance Use Topics  . Smoking status: Former Smoker    Quit date: 07/02/1985  . Smokeless tobacco: Never Used  . Alcohol Use: No    Allergies  Allergen Reactions  . Ibuprofen Other (See Comments)  . Sudafed [Pseudoephedrine Hcl] Other (See Comments)    Current Outpatient Prescriptions  Medication Sig Dispense Refill  . aspirin 325 MG EC tablet Take 325 mg by mouth daily.    . cetirizine (ZYRTEC) 10 MG chewable tablet Chew 10 mg by mouth as needed. Reported on 07/17/2015     No current facility-administered medications for this visit.    Review of Systems Review  of Systems  Constitutional: Negative.   Respiratory: Negative.   Cardiovascular: Negative.     Blood pressure 148/86, pulse 70, resp. rate 16, height 5\' 7"  (1.702 m), weight 191 lb (86.637 kg).  Physical Exam Physical Exam  Constitutional: He is oriented to person, place, and time. He appears well-developed and well-nourished.  Eyes: Conjunctivae are normal. No scleral icterus.  Neck: Neck supple.  Cardiovascular: Normal rate, regular rhythm and normal heart sounds.   Pulmonary/Chest: Effort normal and breath sounds normal.  Abdominal: Soft. Normal appearance and bowel sounds are normal. There is no tenderness. No hernia.    Lymphadenopathy:    He has no cervical adenopathy.  Neurological: He is alert and oriented to person, place, and time.  Skin: Skin is warm and dry.    Data Reviewed Original operative report: Primary repair.  Assessment    No evidence of recurrent umbilical or new epigastric hernia.    Plan    Based on the description from the 2016 office visit no interval change in this vague visible swelling. No evidence of hernia formation. The areas asymptomatic and no further intervention is required.    PCP:  Ashok Norris This information has been scribed by Karie Fetch RN, BSN,BC.  Robert Bellow 12/22/2015, 10:40 AM

## 2015-12-28 ENCOUNTER — Ambulatory Visit: Payer: 59 | Admitting: Family Medicine

## 2016-01-15 ENCOUNTER — Ambulatory Visit: Payer: 59 | Admitting: Urology

## 2016-01-22 ENCOUNTER — Ambulatory Visit (INDEPENDENT_AMBULATORY_CARE_PROVIDER_SITE_OTHER): Payer: 59 | Admitting: Urology

## 2016-01-22 ENCOUNTER — Encounter: Payer: Self-pay | Admitting: Urology

## 2016-01-22 ENCOUNTER — Telehealth: Payer: Self-pay | Admitting: Family Medicine

## 2016-01-22 VITALS — BP 132/69 | HR 73 | Ht 67.0 in | Wt 191.3 lb

## 2016-01-22 DIAGNOSIS — N529 Male erectile dysfunction, unspecified: Secondary | ICD-10-CM

## 2016-01-22 DIAGNOSIS — C61 Malignant neoplasm of prostate: Secondary | ICD-10-CM

## 2016-01-22 NOTE — Progress Notes (Signed)
1:58 PM  01/22/16  Brandon Diaz [birth year] [id number]  Referring provider: Ashok Norris, MD [address] [place], [place] [address]  Chief Complaint  Patient presents with  . Follow-up    Prostate cancer    HPI:  60 yo M with rising PSA to 3.1 (from 2.6) ng/ dL s/p prostate biopsy on 04/28/14. His initial pathology results showed Gleason 3+3 prostate cancer in 3/12 cores, 2 on left (lateral base/ lateral mid) and right apex involving 6-27% of each core. DRE unremarkable (cT1c), TRUS vol 37 cc.   He underwent routine confirmatory biopsy on 08/2014 which showed Gleason 3+3 in 2/12 cores bilateral involving only 1% of each core.  He also has a strong family history of prostate cancer, brother dx and treated around his current same age who underwent radical prostatectomy.  No voiding symptoms.   His most recent PSA 3.3 on 06/2015.  He returns today for DRE/ PSA.    Today, he reports that he has be experiencing some mild new onset ED.  He is able to get an erection but reports that it is not as rigid as it used to be.  He also has been having troubling for maintaining the erection as long.  He is under more stress at work.   PMH: Past Medical History:  Diagnosis Date  . Abnormal prostate specific antigen 12/01/2014  . CA of prostate ([place]) 12/01/2014   followed by urologist   . Decreased libido   . Erectile dysfunction 12/01/2014  . High cholesterol   . Prostate cancer [facility]) 2015   Dr. Erlene Quan    Surgical History: Past Surgical History:  Procedure Laterality Date  . COLONOSCOPY  2014   [facility]  . HERNIA REPAIR  0000000   umilical     Home Medications:    Medication List       Accurate as of 01/22/16  1:58 PM. Always use your most recent med list.          aspirin 325 MG EC tablet Take 325 mg by mouth daily.   cetirizine 10 MG chewable tablet Commonly known as:  ZYRTEC Chew 10 mg by mouth as needed. Reported on 07/17/2015       Allergies:    Allergies  Allergen Reactions  . Ibuprofen Other (See Comments)  . Sudafed [Pseudoephedrine Hcl] Other (See Comments)    Family History: Family History  Problem Relation Age of Onset  . Stroke Mother   . Cancer Brother     pancreatic  . Kidney failure Father   . Prostate cancer Brother     Social History:  reports that he quit smoking about 30 years ago. He has never used smokeless tobacco. He reports that he does not drink alcohol or use drugs.  ROS: UROLOGY Frequent Urination?: No Hard to postpone urination?: No Burning/pain with urination?: No Get up at night to urinate?: No Leakage of urine?: No Urine stream starts and stops?: No Trouble starting stream?: No Do you have to strain to urinate?: No Blood in urine?: No Urinary tract infection?: No Sexually transmitted disease?: No Injury to kidneys or bladder?: No Painful intercourse?: No Weak stream?: No Erection problems?: No Penile pain?: No  Gastrointestinal Nausea?: No Vomiting?: No Indigestion/heartburn?: No Diarrhea?: No Constipation?: No  Constitutional Fever: No Night sweats?: No Weight loss?: No Fatigue?: No  Skin Skin rash/lesions?: No Itching?: No  Eyes Blurred vision?: No Double vision?: No  Ears/Nose/Throat Sore throat?: No Sinus problems?: No  Hematologic/Lymphatic  Swollen glands?: No Easy bruising?: No  Cardiovascular Leg swelling?: No Chest pain?: No  Respiratory Cough?: No Shortness of breath?: No  Endocrine Excessive thirst?: No  Musculoskeletal Back pain?: No Joint pain?: No  Neurological Headaches?: No Dizziness?: No  Psychologic Depression?: No Anxiety?: No  Physical Exam: BP 132/69 (BP Location: Left Arm, Patient Position: Sitting, Cuff Size: Normal)   Pulse 73   Ht 5\' 7"  (1.702 m)   Wt 191 lb 4.8 oz (86.8 kg)   BMI 29.96 kg/m   Constitutional:  Alert and oriented, No acute distress. HEENT: [place] AT, moist mucus membranes.  Trachea midline, no  masses. Cardiovascular: No clubbing, cyanosis, or edema. Respiratory: Normal respiratory effort, no increased work of breathing. GI: Abdomen is soft, nontender, nondistended, no abdominal masses GU: No CVA tenderness. DRE today unremarkable, 40 g rubbery, no nodules.  Normal sphincter tone.   Skin: No rashes, bruises or suspicious lesions. Neurologic: Grossly intact, no focal deficits, moving all 4 extremities. Psychiatric: Normal mood and affect.  Laboratory Data: Lab Results  Component Value Date   WBC 3.8 06/23/2015   HCT 46.5 06/23/2015   MCV 87 06/23/2015   PLT 188 06/23/2015    Lab Results  Component Value Date   CREATININE 0.99 06/23/2015     Lab Results  Component Value Date   PSA 5.7 08/15/2014   PSA 3.5 in 02/06/15  Assessment & Plan:   60 yo AAM dx cT1c Gleason 3+3 prostate cancer in 3/13 cores, iPSA 3.1 with family history of prostate cancer, DRE unremarkable, TRUS vol 37 g. Dx 04/2014.    Repeat biopsy 08/2014, Gleason 3+3 in 2/12 cores, only 1% each core.  1. Prostate cancer Will contact with PSA results. Plan for repeat PSA/DRE in 6 months since he has been stable since dx with reassuring repeat confirmatory biopsy.   - PSA   2. Erectile dysfunction Interested in trial of Viagra Reviewed indications for use and side effects, no contraindications Samples of 100 mg daily give x 2 (advised to start with 1/2 tab) Will call if desires script.  Return in about 6 months (around 07/24/2016) for PSA/ DRE.  Hollice Espy, MD  [facility] [address] [place], [place] [address] [phone]

## 2016-01-23 LAB — PSA: PROSTATE SPECIFIC AG, SERUM: 3.7 ng/mL (ref 0.0–4.0)

## 2016-01-25 ENCOUNTER — Telehealth: Payer: Self-pay | Admitting: *Deleted

## 2016-01-25 NOTE — Telephone Encounter (Signed)
-----   Message from Hollice Espy, MD sent at 01/24/2016  1:38 PM EDT ----- PSA essentially stable.  Will continue to follow in 6 months as planned.  Hollice Espy, MD

## 2016-01-25 NOTE — Telephone Encounter (Signed)
Spoke with patient and gave results and follow up in 6 months. Patient ok with the plan.

## 2016-01-27 NOTE — Telephone Encounter (Signed)
Informed patient that since he stopped medication he will have it restarted when he come back in for appointment on February 15, 2016

## 2016-02-15 ENCOUNTER — Ambulatory Visit (INDEPENDENT_AMBULATORY_CARE_PROVIDER_SITE_OTHER): Payer: 59 | Admitting: Family Medicine

## 2016-02-15 ENCOUNTER — Encounter: Payer: Self-pay | Admitting: Family Medicine

## 2016-02-15 VITALS — BP 122/68 | HR 77 | Temp 98.8°F | Resp 16 | Ht 67.0 in | Wt 194.8 lb

## 2016-02-15 DIAGNOSIS — E785 Hyperlipidemia, unspecified: Secondary | ICD-10-CM | POA: Diagnosis not present

## 2016-02-15 NOTE — Progress Notes (Signed)
Name: Brandon Diaz   MRN: [id number]    DOB: Aug 23, 1955   Date:02/15/2016       Progress Note  Subjective  Chief Complaint  Chief Complaint  Patient presents with  . Hyperlipidemia  This patient is usually followed by Dr. Rutherford Nail, new to me  Hyperlipidemia  This is a chronic problem. The current episode started more than 1 month ago. Recent lipid tests were reviewed and are high. Pertinent negatives include no chest pain or shortness of breath. He is currently on no antihyperlipidemic treatment (Stopped taking Atorvastatin 1.5 years ago). Risk factors for coronary artery disease include dyslipidemia and male sex.     Past Medical History:  Diagnosis Date  . Abnormal prostate specific antigen 12/01/2014  . CA of prostate ([place]) 12/01/2014   followed by urologist   . Decreased libido   . Erectile dysfunction 12/01/2014  . High cholesterol   . Prostate cancer [place] [facility]) 2015   Dr. Erlene Quan    Past Surgical History:  Procedure Laterality Date  . COLONOSCOPY  2014   [facility]  . HERNIA REPAIR  0000000   umilical     Family History  Problem Relation Age of Onset  . Stroke Mother   . Cancer Brother     pancreatic  . Kidney failure Father   . Prostate cancer Brother     Social History   Social History  . Marital status: Married    Spouse name: N/A  . Number of children: N/A  . Years of education: N/A   Occupational History  . Not on file.   Social History Main Topics  . Smoking status: Former Smoker    Quit date: 07/02/1985  . Smokeless tobacco: Never Used  . Alcohol use No  . Drug use: No  . Sexual activity: Not on file   Other Topics Concern  . Not on file   Social History Narrative  . No narrative on file     Current Outpatient Prescriptions:  .  aspirin 325 MG EC tablet, Take 325 mg by mouth daily., Disp: , Rfl:  .  cetirizine (ZYRTEC) 10 MG chewable tablet, Chew 10 mg by mouth as needed. Reported on 07/17/2015, Disp: , Rfl:   Allergies  Allergen Reactions  .  Ibuprofen Other (See Comments)  . Sudafed [Pseudoephedrine Hcl] Other (See Comments)     Review of Systems  Constitutional: Negative for chills, fever and malaise/fatigue.  Respiratory: Negative for cough and shortness of breath.   Cardiovascular: Negative for chest pain and palpitations.  Gastrointestinal: Negative for abdominal pain.    Objective  Vitals:   02/15/16 1333  BP: 122/68  Pulse: 77  Resp: 16  Temp: 98.8 F (37.1 C)  TempSrc: Oral  SpO2: 98%  Weight: 194 lb 12.8 oz (88.4 kg)  Height: 5\' 7"  (1.702 m)    Physical Exam  Constitutional: He is oriented to person, place, and time and well-developed, well-nourished, and in no distress.  HENT:  Head: Normocephalic and atraumatic.  Cardiovascular: Normal rate, regular rhythm, S1 normal, S2 normal and normal heart sounds.   No murmur heard. Pulmonary/Chest: Effort normal and breath sounds normal. He has no wheezes.  Musculoskeletal: He exhibits no edema.  Neurological: He is alert and oriented to person, place, and time.  Psychiatric: Mood, memory, affect and judgment normal.  Nursing note and vitals reviewed.    Assessment & Plan  1. HLD (hyperlipidemia) Wishes to be restarted back on statin therapy. We will obtain an updated FLP and  liver function and treat as appropriate. - Lipid Profile - COMPLETE METABOLIC PANEL WITH GFR   Taneya Conkel [facility] 02/15/2016 1:42 PM

## 2016-02-19 ENCOUNTER — Other Ambulatory Visit: Payer: Self-pay | Admitting: Family Medicine

## 2016-02-20 LAB — COMPREHENSIVE METABOLIC PANEL
ALK PHOS: 54 IU/L (ref 39–117)
ALT: 29 IU/L (ref 0–44)
AST: 24 IU/L (ref 0–40)
Albumin/Globulin Ratio: 1.7 (ref 1.2–2.2)
Albumin: 4.1 g/dL (ref 3.5–5.5)
BUN/Creatinine Ratio: 16 (ref 9–20)
BUN: 15 mg/dL (ref 6–24)
Bilirubin Total: 0.7 mg/dL (ref 0.0–1.2)
CO2: 24 mmol/L (ref 18–29)
CREATININE: 0.94 mg/dL (ref 0.76–1.27)
Calcium: 8.8 mg/dL (ref 8.7–10.2)
Chloride: 98 mmol/L (ref 96–106)
GFR calc Af Amer: 103 mL/min/{1.73_m2} (ref >59)
GFR calc non Af Amer: 89 mL/min/{1.73_m2} (ref >59)
GLOBULIN, TOTAL: 2.4 g/dL (ref 1.5–4.5)
GLUCOSE: 80 mg/dL (ref 65–99)
Potassium: 4.2 mmol/L (ref 3.5–5.2)
SODIUM: 137 mmol/L (ref 134–144)
Total Protein: 6.5 g/dL (ref 6.0–8.5)

## 2016-02-20 LAB — LIPID PANEL WITH LDL/HDL RATIO
CHOLESTEROL TOTAL: 224 mg/dL — AB (ref 100–199)
HDL: 45 mg/dL (ref >39)
LDL CALC: 154 mg/dL — AB (ref 0–99)
LDl/HDL Ratio: 3.4 ratio units (ref 0.0–3.6)
TRIGLYCERIDES: 123 mg/dL (ref 0–149)
VLDL Cholesterol Cal: 25 mg/dL (ref 5–40)

## 2016-02-26 ENCOUNTER — Telehealth: Payer: Self-pay

## 2016-02-26 MED ORDER — ROSUVASTATIN CALCIUM 10 MG PO TABS: 10.0000 mg | ORAL_TABLET | Freq: Every day | ORAL | 0 refills | Status: DC

## 2016-02-26 NOTE — Telephone Encounter (Signed)
Patient has been notified of lab results and a prescription for Rosuvastatin 10 mg daily at bedtime has been sent to [facility] per Dr. Manuella Ghazi, pt has been notified and verbalized understanding

## 2016-03-29 ENCOUNTER — Ambulatory Visit (INDEPENDENT_AMBULATORY_CARE_PROVIDER_SITE_OTHER): Payer: 59 | Admitting: Family Medicine

## 2016-03-29 ENCOUNTER — Encounter: Payer: Self-pay | Admitting: Family Medicine

## 2016-03-29 DIAGNOSIS — R05 Cough: Secondary | ICD-10-CM | POA: Diagnosis not present

## 2016-03-29 DIAGNOSIS — R058 Other specified cough: Secondary | ICD-10-CM | POA: Insufficient documentation

## 2016-03-29 MED ORDER — GUAIFENESIN-CODEINE 100-10 MG/5ML PO SYRP: 5.0000 mL | ORAL_SOLUTION | Freq: Four times a day (QID) | ORAL | 0 refills | Status: DC | PRN

## 2016-03-29 MED ORDER — AZITHROMYCIN 250 MG PO TABS: ORAL_TABLET | ORAL | 0 refills | Status: DC

## 2016-03-29 NOTE — Progress Notes (Signed)
Name: Brandon Diaz   MRN: [id number]    DOB: 1956/01/06   Date:03/29/2016       Progress Note  Subjective  Chief Complaint  Chief Complaint  Patient presents with  . Cough    congestion in chest    Cough  This is a new problem. Episode onset: 4 weeks ago. The problem has been gradually improving. The cough is productive of sputum. Pertinent negatives include no chills, ear pain, fever, headaches, shortness of breath or sweats. The symptoms are aggravated by dust and pollens. He has tried nothing (Has tried Zyrtec a couple of times) for the symptoms. His past medical history is significant for bronchitis.    Past Medical History:  Diagnosis Date  . Abnormal prostate specific antigen 12/01/2014  . CA of prostate ([place]) 12/01/2014   followed by urologist   . Decreased libido   . Erectile dysfunction 12/01/2014  . High cholesterol   . Prostate cancer [facility]) 2015   Dr. Erlene Quan    Past Surgical History:  Procedure Laterality Date  . COLONOSCOPY  2014   [facility]  . HERNIA REPAIR  0000000   umilical     Family History  Problem Relation Age of Onset  . Stroke Mother   . Cancer Brother     pancreatic  . Kidney failure Father   . Prostate cancer Brother     Social History   Social History  . Marital status: Married    Spouse name: N/A  . Number of children: N/A  . Years of education: N/A   Occupational History  . Not on file.   Social History Main Topics  . Smoking status: Former Smoker    Quit date: 07/02/1985  . Smokeless tobacco: Never Used  . Alcohol use No  . Drug use: No  . Sexual activity: Not on file   Other Topics Concern  . Not on file   Social History Narrative  . No narrative on file     Current Outpatient Prescriptions:  .  rosuvastatin (CRESTOR) 10 MG tablet, Take 1 tablet (10 mg total) by mouth at bedtime., Disp: 90 tablet, Rfl: 0 .  aspirin 325 MG EC tablet, Take 325 mg by mouth daily., Disp: , Rfl:  .  cetirizine (ZYRTEC) 10 MG chewable tablet, Chew  10 mg by mouth as needed. Reported on 07/17/2015, Disp: , Rfl:   Allergies  Allergen Reactions  . Ibuprofen Other (See Comments)  . Sudafed [Pseudoephedrine Hcl] Other (See Comments)     Review of Systems  Constitutional: Negative for chills and fever.  HENT: Negative for ear pain.   Respiratory: Positive for cough. Negative for shortness of breath.   Neurological: Negative for headaches.      Objective  Vitals:   03/29/16 1435  BP: 116/64  Pulse: 69  Temp: 98.3 F (36.8 C)  SpO2: 97%  Weight: 198 lb 4.8 oz (89.9 kg)    Physical Exam  Constitutional: He is well-developed, well-nourished, and in no distress.  HENT:  Right Ear: Tympanic membrane and ear canal normal.  Left Ear: Tympanic membrane and ear canal normal.  Nose: Right sinus exhibits no maxillary sinus tenderness and no frontal sinus tenderness. Left sinus exhibits no maxillary sinus tenderness and no frontal sinus tenderness.  Mouth/Throat: Posterior oropharyngeal erythema present.  Cardiovascular: Normal rate, regular rhythm, S1 normal, S2 normal and normal heart sounds.   No murmur heard. Pulmonary/Chest: Effort normal and breath sounds normal. He has no decreased breath sounds. He has  no wheezes. He has no rales.  Nursing note and vitals reviewed.   Assessment & Plan  1. Productive cough Started on antibiotic therapy based on the duration of symptoms. If unresponsive to antibiotic therapy, will need a chest x-ray - azithromycin (ZITHROMAX) 250 MG tablet; 2 tabs po day 1, then 1 tab po q day x 4 days  Dispense: 6 tablet; Refill: 0 - guaiFENesin-codeine (CHERATUSSIN AC) 100-10 MG/5ML syrup; Take 5 mLs by mouth 4 (four) times daily as needed for cough.  Dispense: 140 mL; Refill: 0   Onica Davidovich Asad A. [facility] 03/29/2016 2:59 PM

## 2016-04-08 ENCOUNTER — Telehealth: Payer: Self-pay | Admitting: Family Medicine

## 2016-04-08 DIAGNOSIS — R058 Other specified cough: Secondary | ICD-10-CM

## 2016-04-08 DIAGNOSIS — R05 Cough: Secondary | ICD-10-CM

## 2016-04-08 MED ORDER — MOXIFLOXACIN HCL 400 MG PO TABS: 400.0000 mg | ORAL_TABLET | Freq: Every day | ORAL | 0 refills | Status: DC

## 2016-04-08 MED ORDER — FLUTICASONE-SALMETEROL 250-50 MCG/DOSE IN AEPB: 1.0000 | INHALATION_SPRAY | Freq: Two times a day (BID) | RESPIRATORY_TRACT | 3 refills | Status: DC

## 2016-04-08 NOTE — Addendum Note (Signed)
Addended byManuella Ghazi, Draedyn Weidinger A A on: 04/08/2016 04:01 PM   Modules accepted: Orders

## 2016-04-08 NOTE — Telephone Encounter (Signed)
Pt has came in today and said that he still has all the symptoms that he had at his appt Tuesday with you. He said that he usually get the Zpack that are the large  pills and that he usually gets also Advair but says that he was not given any of this. When standing in front of me he was very SOB when trying to take and the cough sounds very tight. Pharm is [facility] in [place]. Please advise pt. Phone # [phone] or [phone]. Can leave a message.

## 2016-04-08 NOTE — Telephone Encounter (Signed)
patient reports coughing is only marginally better, still feels congested, in the past he has taken Advair inhaler for similar symptoms, requesting something stronger than azithromycin which he has finished. We will start on moxifloxacin 400 mg by mouth daily 5 days, Advair inhaler, if no clinical improvement, will return early next week to obtain chest x-ray.

## 2016-07-15 ENCOUNTER — Other Ambulatory Visit: Payer: Self-pay

## 2016-07-15 ENCOUNTER — Encounter: Payer: Self-pay | Admitting: Urology

## 2016-07-15 ENCOUNTER — Other Ambulatory Visit
Admission: RE | Admit: 2016-07-15 | Discharge: 2016-07-15 | Disposition: A | Discharge status: Home / Self Care | Payer: 59 | Source: Ambulatory Visit | Attending: Urology | Admitting: Urology

## 2016-07-15 ENCOUNTER — Ambulatory Visit (INDEPENDENT_AMBULATORY_CARE_PROVIDER_SITE_OTHER): Payer: 59 | Admitting: Urology

## 2016-07-15 VITALS — BP 147/84 | HR 96 | Ht 67.0 in | Wt 197.0 lb

## 2016-07-15 DIAGNOSIS — C61 Malignant neoplasm of prostate: Secondary | ICD-10-CM

## 2016-07-15 DIAGNOSIS — N529 Male erectile dysfunction, unspecified: Secondary | ICD-10-CM

## 2016-07-15 LAB — PSA: PSA: 4.27 ng/mL — ABNORMAL HIGH (ref 0.00–4.00)

## 2016-07-15 NOTE — Progress Notes (Signed)
2:46 PM  07/15/16  Brandon Diaz 1955-10-25 [id number]  Referring provider: Ashok Norris, MD No address on file  Chief Complaint  Patient presents with  . Prostate Cancer    85month    HPI:  61 yo M with rising PSA to 3.1 (from 2.6) ng/ dL s/p prostate biopsy on 04/28/14. His initial pathology results showed Gleason 3+3 prostate cancer in 3/12 cores, 2 on left (lateral base/ lateral mid) and right apex involving 6-27% of each core. DRE unremarkable (cT1c), TRUS vol 37 cc.   He underwent routine confirmatory biopsy on 08/2014 which showed Gleason 3+3 in 2/12 cores bilateral involving only 1% of each core.  He also has a strong family history of prostate cancer, brother dx and treated around his current same age who underwent radical prostatectomy.  No voiding symptoms.   His most recent PSA 3.7 on 12/2015.  He does have some mild baseline erectile dysfunction. Last visit, he was given Viagra 50 mg which worked well. He still has one tablet left. He will let us know if he would like prescription for this. No side effects.  He returns today for DRE/ PSA.      PMH: Past Medical History:  Diagnosis Date  . Abnormal prostate specific antigen 12/01/2014  . CA of prostate ([place]) 12/01/2014   followed by urologist   . Decreased libido   . Erectile dysfunction 12/01/2014  . High cholesterol   . Prostate cancer [facility]) 2015   Dr. Erlene Quan    Surgical History: Past Surgical History:  Procedure Laterality Date  . COLONOSCOPY  2014   [facility]  . HERNIA REPAIR  0000000   umilical     Home Medications:  Allergies as of 07/15/2016      Reactions   Ibuprofen Other (See Comments)   Sudafed [pseudoephedrine Hcl] Other (See Comments)      Medication List       Accurate as of 07/15/16  2:46 PM. Always use your most recent med list.          aspirin 325 MG EC tablet Take 325 mg by mouth daily.   rosuvastatin 10 MG tablet Commonly known as:  CRESTOR Take 1 tablet (10 mg  total) by mouth at bedtime.       Allergies:  Allergies  Allergen Reactions  . Ibuprofen Other (See Comments)  . Sudafed [Pseudoephedrine Hcl] Other (See Comments)    Family History: Family History  Problem Relation Age of Onset  . Stroke Mother   . Cancer Brother     pancreatic  . Kidney failure Father   . Prostate cancer Brother     Social History:  reports that he quit smoking about 31 years ago. He has never used smokeless tobacco. He reports that he does not drink alcohol or use drugs.  ROS: UROLOGY Frequent Urination?: No Hard to postpone urination?: No Burning/pain with urination?: No Get up at night to urinate?: No Leakage of urine?: No Urine stream starts and stops?: No Trouble starting stream?: No Do you have to strain to urinate?: No Blood in urine?: No Urinary tract infection?: No Sexually transmitted disease?: No Injury to kidneys or bladder?: No Painful intercourse?: No Weak stream?: No Erection problems?: No Penile pain?: No  Gastrointestinal Nausea?: No Vomiting?: No Indigestion/heartburn?: No Diarrhea?: No Constipation?: No  Constitutional Fever: No Night sweats?: No Weight loss?: No Fatigue?: No  Skin Skin rash/lesions?: No Itching?: No  Eyes Blurred vision?: No Double vision?: No  Ears/Nose/Throat Sore  throat?: No Sinus problems?: No  Hematologic/Lymphatic Swollen glands?: No Easy bruising?: No  Cardiovascular Leg swelling?: No Chest pain?: No  Respiratory Cough?: No Shortness of breath?: No  Endocrine Excessive thirst?: No  Musculoskeletal Back pain?: No Joint pain?: No  Neurological Headaches?: No Dizziness?: No  Psychologic Depression?: No Anxiety?: No  Physical Exam: BP (!) 147/84   Pulse 96   Ht 5\' 7"  (1.702 m)   Wt 197 lb (89.4 kg)   BMI 30.85 kg/m   Constitutional:  Alert and oriented, No acute distress. HEENT: [place] AT, moist mucus membranes.  Trachea midline, no masses. Cardiovascular: No  clubbing, cyanosis, or edema. Respiratory: Normal respiratory effort, no increased work of breathing. GI: Abdomen is soft, nontender, nondistended, no abdominal masses GU: No CVA tenderness. DRE today unremarkable, 40 g rubbery, no nodules.  Normal sphincter tone.   Skin: No rashes, bruises or suspicious lesions. Neurologic: Grossly intact, no focal deficits, moving all 4 extremities. Psychiatric: Normal mood and affect.  Laboratory Data: Lab Results  Component Value Date   WBC 3.8 06/23/2015   HCT 46.5 06/23/2015   MCV 87 06/23/2015   PLT 188 06/23/2015    Lab Results  Component Value Date   CREATININE 0.94 02/19/2016     Component     Latest Ref Rng & Units 08/15/2014 02/06/2015 07/17/2015 01/22/2016  PSA     0.0 - 4.0 ng/mL 5.7 3.5 3.3 3.7     Assessment & Plan:   61 yo AAM dx cT1c Gleason 3+3 prostate cancer in 3/13 cores, iPSA 3.1 with family history of prostate cancer, DRE unremarkable, TRUS vol 37 g. Dx 04/2014.    Repeat biopsy 08/2014, Gleason 3+3 in 2/12 cores, only 1% each core.  1. Prostate cancer Will contact with PSA results. Plan for repeat PSA/DRE in 6 months since he has been stable since dx with reassuring repeat confirmatory biopsy.   - PSA   2. Erectile dysfunction Viagra 50 mg prn, works well  Return in about 6 months (around 01/12/2017) for PSA/ DRE.  Hollice Espy, MD  [facility] [address], [place], [place] [address] [phone]

## 2016-07-22 ENCOUNTER — Ambulatory Visit: Payer: 59 | Admitting: Urology

## 2017-01-19 ENCOUNTER — Other Ambulatory Visit: Payer: Self-pay

## 2017-01-19 ENCOUNTER — Other Ambulatory Visit: Payer: 59

## 2017-01-19 DIAGNOSIS — R972 Elevated prostate specific antigen [PSA]: Secondary | ICD-10-CM

## 2017-01-20 ENCOUNTER — Encounter: Payer: Self-pay | Admitting: Urology

## 2017-01-20 ENCOUNTER — Ambulatory Visit (INDEPENDENT_AMBULATORY_CARE_PROVIDER_SITE_OTHER): Payer: 59 | Admitting: Urology

## 2017-01-20 VITALS — BP 165/75 | HR 96 | Ht 67.0 in | Wt 190.0 lb

## 2017-01-20 DIAGNOSIS — N529 Male erectile dysfunction, unspecified: Secondary | ICD-10-CM | POA: Diagnosis not present

## 2017-01-20 DIAGNOSIS — C61 Malignant neoplasm of prostate: Secondary | ICD-10-CM | POA: Diagnosis not present

## 2017-01-20 LAB — PSA: PROSTATE SPECIFIC AG, SERUM: 4.2 ng/mL — AB (ref 0.0–4.0)

## 2017-01-20 NOTE — Progress Notes (Signed)
4:06 PM  01/20/17  Brandon Diaz 06/02/1956 [id number]  Referring provider: Ashok Norris, MD No address on file  Chief Complaint  Patient presents with  . Prostate Cancer    HPI:  61 yo M with rising PSA to 3.1 (from 2.6) ng/ dL s/p prostate biopsy on 04/28/14. His initial pathology results showed Gleason 3+3 prostate cancer in 3/12 cores, 2 on left (lateral base/ lateral mid) and right apex involving 6-27% of each core. DRE unremarkable (cT1c), TRUS vol 37 cc.   He underwent routine confirmatory biopsy on 08/2014 which showed Gleason 3+3 in 2/12 cores bilateral involving only 1% of each core.  He also has a strong family history of prostate cancer, brother dx and treated around his current same age who underwent radical prostatectomy.  No voiding symptoms.   His most recent PSA is stable from 6 months ago but up from previous. It was 4.27 on 07/15/2016.  Repeat today was 4.2.  He does have some mild baseline erectile dysfunction. He has been using Viagra with good result.   PMH: Past Medical History:  Diagnosis Date  . Abnormal prostate specific antigen 12/01/2014  . CA of prostate ([place]) 12/01/2014   followed by urologist   . Decreased libido   . Erectile dysfunction 12/01/2014  . High cholesterol   . Prostate cancer [facility]) 2015   Dr. Erlene Quan    Surgical History: Past Surgical History:  Procedure Laterality Date  . COLONOSCOPY  2014   [facility]  . HERNIA REPAIR  8676   umilical     Home Medications:  Allergies as of 01/20/2017      Reactions   Ibuprofen Other (See Comments)   Sudafed [pseudoephedrine Hcl] Other (See Comments)      Medication List       Accurate as of 01/20/17  4:06 PM. Always use your most recent med list.          aspirin 325 MG EC tablet Take 325 mg by mouth daily.       Allergies:  Allergies  Allergen Reactions  . Ibuprofen Other (See Comments)  . Sudafed [Pseudoephedrine Hcl] Other (See Comments)    Family  History: Family History  Problem Relation Age of Onset  . Stroke Mother   . Cancer Brother        pancreatic  . Kidney failure Father   . Prostate cancer Brother     Social History:  reports that he quit smoking about 31 years ago. He has never used smokeless tobacco. He reports that he does not drink alcohol or use drugs.  ROS: UROLOGY Frequent Urination?: No Hard to postpone urination?: No Burning/pain with urination?: No Get up at night to urinate?: No Leakage of urine?: No Urine stream starts and stops?: No Trouble starting stream?: No Do you have to strain to urinate?: No Blood in urine?: No Urinary tract infection?: No Sexually transmitted disease?: No Injury to kidneys or bladder?: No Painful intercourse?: No Weak stream?: No Erection problems?: No Penile pain?: No  Gastrointestinal Nausea?: No Vomiting?: No Indigestion/heartburn?: No Diarrhea?: No Constipation?: No  Constitutional Fever: No Night sweats?: No Weight loss?: No Fatigue?: No  Skin Skin rash/lesions?: No Itching?: No  Eyes Blurred vision?: No Double vision?: No  Ears/Nose/Throat Sore throat?: No Sinus problems?: No  Hematologic/Lymphatic Swollen glands?: No Easy bruising?: No  Cardiovascular Leg swelling?: No Chest pain?: No  Respiratory Cough?: No Shortness of breath?: No  Endocrine Excessive thirst?: No  Musculoskeletal Back pain?: No  Joint pain?: No  Neurological Headaches?: No Dizziness?: No  Psychologic Depression?: No Anxiety?: No  Physical Exam: BP (!) 165/75   Pulse 96   Ht 5\' 7"  (1.702 m)   Wt 190 lb (86.2 kg)   BMI 29.76 kg/m   Constitutional:  Alert and oriented, No acute distress. HEENT: [place] AT, moist mucus membranes.  Trachea midline, no masses. Cardiovascular: No clubbing, cyanosis, or edema. Respiratory: Normal respiratory effort, no increased work of breathing. GI: Abdomen is soft, nontender, nondistended, no abdominal masses GU: No CVA  tenderness.  Rectal exam deferred today- unremarkable 06/2016 Skin: No rashes, bruises or suspicious lesions. Neurologic: Grossly intact, no focal deficits, moving all 4 extremities. Psychiatric: Normal mood and affect.  Laboratory Data: Lab Results  Component Value Date   WBC 3.8 06/23/2015   HGB 15.5 06/23/2015   HCT 46.5 06/23/2015   MCV 87 06/23/2015   PLT 188 06/23/2015    Lab Results  Component Value Date   CREATININE 0.94 02/19/2016     Component     Latest Ref Rng & Units 02/06/2015 07/17/2015 01/22/2016 01/19/2017  Prostate Specific Ag, Serum     0.0 - 4.0 ng/mL 3.5 3.3 3.7 4.2 (H)    Assessment & Plan:   61 yo AAM dx cT1c Gleason 3+3 prostate cancer in 3/13 cores, iPSA 3.1 with family history of prostate cancer, DRE unremarkable, TRUS vol 37 g. Dx 04/2014.    Repeat biopsy 08/2014, Gleason 3+3 in 2/12 cores, only 1% each core.  1. Prostate cancer PSA stable x 6 months Plan for repeat PSA/DRE in 6 months - PSA   2. Erectile dysfunction Viagra 50 mg prn, works well  Return in about 6 months (around 07/23/2017) for PSA/ DRE.  Hollice Espy, MD  [facility] [phone]

## 2017-05-03 ENCOUNTER — Ambulatory Visit (INDEPENDENT_AMBULATORY_CARE_PROVIDER_SITE_OTHER): Payer: 59 | Admitting: Family Medicine

## 2017-05-03 ENCOUNTER — Encounter: Payer: Self-pay | Admitting: Family Medicine

## 2017-05-03 VITALS — BP 120/84 | HR 81 | Temp 98.4°F | Resp 16 | Ht 67.0 in | Wt 187.4 lb

## 2017-05-03 DIAGNOSIS — G5691 Unspecified mononeuropathy of right upper limb: Secondary | ICD-10-CM

## 2017-05-03 DIAGNOSIS — C61 Malignant neoplasm of prostate: Secondary | ICD-10-CM

## 2017-05-03 DIAGNOSIS — E785 Hyperlipidemia, unspecified: Secondary | ICD-10-CM | POA: Diagnosis not present

## 2017-05-03 MED ORDER — GABAPENTIN 100 MG PO CAPS: 100.0000 mg | ORAL_CAPSULE | Freq: Every day | ORAL | 0 refills | Status: DC

## 2017-05-03 NOTE — Progress Notes (Signed)
Name: Brandon Diaz   MRN: [id number]    DOB: January 14, 1956   Date:05/03/2017       Progress Note  Subjective  Chief Complaint  Chief Complaint  Patient presents with  . Arm Pain    right arm pain , 3 fingers going numb     HPI  Patient presents with complaint of 1 week of RIGHT arm pain. 1 week ago had shoulder pain upon waking x1 episode, then pain radiated into the lateral side of the upper arm.  Now experiencing only thumb, index, and middle finger numbness/tingling when he rests elbow on a table-top - goes away after repositioning. 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 Does endorse some overuse lately, but no acute injury.  No extremity weakness, no other neurologic deficits.  Patient is LEFT handed.  Upcoming Physical: Patient requests to have lab work ordered so that he may have the results prior to his upcoming physical on 05/11/2017.  CMP, CBC, Lipid panel will be ordered for him today.  Patient Active Problem List   Diagnosis Date Noted  . Productive cough 03/29/2016  . History of umbilical hernia repair 01/60/1093  . Abnormal prostate specific antigen 12/01/2014  . CA of prostate ([place]) 12/01/2014  . Screening for human immunodeficiency virus 12/01/2014  . Erectile dysfunction 12/01/2014  . HLD (hyperlipidemia) 03/26/2010  . Decreased libido 07/16/2008    Social History   Tobacco Use  . Smoking status: Former Smoker    Last attempt to quit: 07/02/1985    Years since quitting: 31.8  . Smokeless tobacco: Never Used  Substance Use Topics  . Alcohol use: No    Alcohol/week: 0.0 oz     Current Outpatient Medications:  .  aspirin 325 MG EC tablet, Take 325 mg by mouth daily., Disp: , Rfl:   Allergies  Allergen Reactions  . Ibuprofen Other (See Comments)  . Sudafed [Pseudoephedrine Hcl] Other (See Comments)    ROS  Constitutional: Negative for fever or weight change.   Respiratory: Negative for cough and shortness of breath.   Cardiovascular: Negative for chest pain or palpitations.  Gastrointestinal: Negative for abdominal pain, no bowel changes.  Musculoskeletal: Negative for gait problem or joint swelling.  See HPI Skin: Negative for rash.  Neurological: Negative for dizziness or headache.  No other specific complaints in a complete review of systems (except as listed in HPI above).  Objective  Vitals:   05/03/17 1549  BP: 120/84  Pulse: 81  Resp: 16  Temp: 98.4 F (36.9 C)  TempSrc: Oral  SpO2: 97%  Weight: 187 lb 6.4 oz (85 kg)  Height: 5\' 7"  (1.702 m)   Body mass index is 29.35 kg/m.  Nursing Note and Vital Signs reviewed.  Physical Exam  Constitutional: Patient appears well-developed and well-nourished.  No distress.  HEENT: head atraumatic, normocephalic, pupils equal and reactive to light, EOM's intact; neck supple without lymphadenopathy Cardiovascular: Normal rate, regular rhythm, S1/S2 present.  No murmur or rub heard. No BLE edema. Pulmonary/Chest: Effort normal and breath sounds clear. No respiratory distress or retractions. Psychiatric: Patient has a normal mood and affect. behavior is normal. Judgment and thought content normal.  Musculoskeletal: Normal range of motion, no joint effusions. No gross deformities. Negative radial and ulnar tinel's at the elbow; negative tinel's at the wrist. Pt able to recreate sensation in office - no weakness while experiencing numbness/tingling.  BUE are strong,  Radial pulses +2, non-tender, full AROM. Neurological: he is alert and oriented to person, place, and time. No cranial nerve deficit. Coordination, balance, strength, speech and gait are normal. Rapid alternative movements are intact. Skin: Skin is warm and dry. No rash noted. No erythema.  Psychiatric: Patient has a normal mood and affect. behavior is normal. Judgment and thought content normal.  No results found for this or any  previous visit (from the past 2160 hour(s)).   Assessment & Plan  1. Neuropathy of right upper extremity - gabapentin (NEURONTIN) 100 MG capsule; Take 1-2 capsules (100-200 mg total) daily by mouth. Take 100mg  each night for three days, then take 100mg  each morning and each night  Dispense: 30 capsule; Refill: 0 - After a very lengthy discussion regarding options - Gabapentin, (pt unable to take NSAIDS), nerve conduction studies, referral to neurology - he would like to do a trial of low-dose gabapentin as above, and return for CPE/follow up with PCP in 1 week.  - Advised that he also purchase a carpal tunnel wrist brace to wear when possible to offer support of the RUE.  He is willing to consider nerve conduction studies and/or referral if not improving.  2. Hyperlipidemia, unspecified hyperlipidemia type - COMPLETE METABOLIC PANEL WITH GFR - Lipid panel 3. CA of prostate ([facility]) - COMPLETE METABOLIC PANEL WITH GFR - CBC - Labs ordered per patient request in anticipation of CPE with Dr. Manuella Ghazi in 1 week.  - Return in about 1 week (around 05/10/2017) for Follow up w/ Dr. Manuella Ghazi & CPE.  -Red flags and when to present for emergency care or RTC including fever >101.25F, chest pain, shortness of breath, new/worsening/un-resolving symptoms, numbness/tingling that do not resolve with repositioning, extremity weakness, reviewed with patient at time of visit. Follow up and care instructions discussed and provided in AVS.

## 2017-05-03 NOTE — Patient Instructions (Addendum)
- Please purchase a carpal tunnel wrist brace for your right arm - wear this as often as possible. - You may try Turmeric to help decrease inflammation   Peripheral Neuropathy Peripheral neuropathy is a type of nerve damage. It affects nerves that carry signals between the spinal cord and other parts of the body. These are called peripheral nerves. With peripheral neuropathy, one nerve or a group of nerves may be damaged. What are the causes? Many things can damage peripheral nerves. For some people with peripheral neuropathy, the cause is unknown. Some causes include:  Diabetes. This is the most common cause of peripheral neuropathy.  Injury to a nerve.  Pressure or stress on a nerve that lasts a long time.  Too little vitamin B. Alcoholism can lead to this.  Infections.  Autoimmune diseases, such as multiple sclerosis and systemic lupus erythematosus.  Inherited nerve diseases.  Some medicines, such as cancer drugs.  Toxic substances, such as lead and mercury.  Too little blood flowing to the legs.  Kidney disease.  Thyroid disease.  What are the signs or symptoms? Different people have different symptoms. The symptoms you have will depend on which of your nerves is damaged. Common symptoms include:  Loss of feeling (numbness) in the feet and hands.  Tingling in the feet and hands.  Pain that burns.  Very sensitive skin.  Weakness.  Not being able to move a part of the body (paralysis).  Muscle twitching.  Clumsiness or poor coordination.  Loss of balance.  Not being able to control your bladder.  Feeling dizzy.  Sexual problems.  How is this diagnosed? Peripheral neuropathy is a symptom, not a disease. Finding the cause of peripheral neuropathy can be hard. To figure that out, your health care provider will take a medical history and do a physical exam. A neurological exam will also be done. This involves checking things affected by your brain, spinal  cord, and nerves (nervous system). For example, your health care provider will check your reflexes, how you move, and what you can feel. Other types of tests may also be ordered, such as:  Blood tests.  A test of the fluid in your spinal cord.  Imaging tests, such as CT scans or an MRI.  Electromyography (EMG). This test checks the nerves that control muscles.  Nerve conduction velocity tests. These tests check how fast messages pass through your nerves.  Nerve biopsy. A small piece of nerve is removed. It is then checked under a microscope.  How is this treated?  Medicine is often used to treat peripheral neuropathy. Medicines may include: ? Pain-relieving medicines. Prescription or over-the-counter medicine may be suggested. ? Antiseizure medicine. This may be used for pain. ? Antidepressants. These also may help ease pain from neuropathy. ? Lidocaine. This is a numbing medicine. You might wear a patch or be given a shot. ? Mexiletine. This medicine is typically used to help control irregular heart rhythms.  Surgery. Surgery may be needed to relieve pressure on a nerve or to destroy a nerve that is causing pain.  Physical therapy to help movement.  Assistive devices to help movement. Follow these instructions at home:  Only take over-the-counter or prescription medicines as directed by your health care provider. Follow the instructions carefully for any given medicines. Do not take any other medicines without first getting approval from your health care provider.  If you have diabetes, work closely with your health care provider to keep your blood sugar under control.  If you have numbness in your feet: ? Check every day for signs of injury or infection. Watch for redness, warmth, and swelling. ? Wear padded socks and comfortable shoes. These help protect your feet.  Do not do things that put pressure on your damaged nerve.  Do not smoke. Smoking keeps blood from getting to  damaged nerves.  Avoid or limit alcohol. Too much alcohol can cause a lack of B vitamins. These vitamins are needed for healthy nerves.  Develop a good support system. Coping with peripheral neuropathy can be stressful. Talk to a mental health specialist or join a support group if you are struggling.  Follow up with your health care provider as directed. Contact a health care provider if:  You have new signs or symptoms of peripheral neuropathy.  You are struggling emotionally from dealing with peripheral neuropathy.  You have a fever. Get help right away if:  You have an injury or infection that is not healing.  You feel very dizzy or begin vomiting.  You have chest pain.  You have trouble breathing. This information is not intended to replace advice given to you by your health care provider. Make sure you discuss any questions you have with your health care provider. Document Released: 06/03/2002 Document Revised: 11/19/2015 Document Reviewed: 02/18/2013 Elsevier Interactive Patient Education  2017 [employer].

## 2017-05-08 ENCOUNTER — Ambulatory Visit: Payer: 59

## 2017-05-08 LAB — COMPLETE METABOLIC PANEL WITH GFR
AG RATIO: 1.5 (calc) (ref 1.0–2.5)
ALKALINE PHOSPHATASE (APISO): 56 U/L (ref 40–115)
ALT: 40 U/L (ref 9–46)
AST: 28 U/L (ref 10–35)
Albumin: 3.9 g/dL (ref 3.6–5.1)
BILIRUBIN TOTAL: 0.4 mg/dL (ref 0.2–1.2)
BUN: 14 mg/dL (ref 7–25)
CO2: 31 mmol/L (ref 20–32)
Calcium: 9 mg/dL (ref 8.6–10.3)
Chloride: 105 mmol/L (ref 98–110)
Creat: 0.99 mg/dL (ref 0.70–1.33)
GFR, Est African American: 96 mL/min/{1.73_m2} (ref >=60)
GFR, Est Non African American: 83 mL/min/{1.73_m2} (ref >=60)
GLOBULIN: 2.6 g/dL (ref 1.9–3.7)
Glucose, Bld: 87 mg/dL (ref 65–99)
POTASSIUM: 4 mmol/L (ref 3.5–5.3)
SODIUM: 142 mmol/L (ref 135–146)
Total Protein: 6.5 g/dL (ref 6.1–8.1)

## 2017-05-08 LAB — CBC
HCT: 43.5 % (ref 38.5–50.0)
HEMOGLOBIN: 14.6 g/dL (ref 13.2–17.1)
MCH: 28.9 pg (ref 27.0–33.0)
MCHC: 33.6 g/dL (ref 32.0–36.0)
MCV: 86.1 fL (ref 80.0–100.0)
MPV: 11.1 fL (ref 7.5–12.5)
Platelets: 189 10*3/uL (ref 140–400)
RBC: 5.05 10*6/uL (ref 4.20–5.80)
RDW: 12.9 % (ref 11.0–15.0)
WBC: 3.4 10*3/uL — ABNORMAL LOW (ref 3.8–10.8)

## 2017-05-08 LAB — LIPID PANEL
CHOLESTEROL: 229 mg/dL — AB (ref <200)
HDL: 50 mg/dL (ref >40)
LDL Cholesterol (Calc): 160 mg/dL (calc) — ABNORMAL HIGH
Non-HDL Cholesterol (Calc): 179 mg/dL (calc) — ABNORMAL HIGH (ref <130)
Total CHOL/HDL Ratio: 4.6 (calc) (ref <5.0)
Triglycerides: 88 mg/dL (ref <150)

## 2017-05-11 ENCOUNTER — Ambulatory Visit (INDEPENDENT_AMBULATORY_CARE_PROVIDER_SITE_OTHER): Payer: 59 | Admitting: Family Medicine

## 2017-05-11 ENCOUNTER — Encounter: Payer: Self-pay | Admitting: Family Medicine

## 2017-05-11 VITALS — BP 122/80 | HR 95 | Temp 98.2°F | Resp 16 | Ht 67.0 in | Wt 189.2 lb

## 2017-05-11 DIAGNOSIS — D72819 Decreased white blood cell count, unspecified: Secondary | ICD-10-CM

## 2017-05-11 DIAGNOSIS — E7800 Pure hypercholesterolemia, unspecified: Secondary | ICD-10-CM

## 2017-05-11 DIAGNOSIS — Z0000 Encounter for general adult medical examination without abnormal findings: Secondary | ICD-10-CM

## 2017-05-11 DIAGNOSIS — Z1159 Encounter for screening for other viral diseases: Secondary | ICD-10-CM

## 2017-05-11 MED ORDER — ROSUVASTATIN CALCIUM 10 MG PO TABS: 10.0000 mg | ORAL_TABLET | Freq: Every day | ORAL | 0 refills | Status: DC

## 2017-05-11 NOTE — Progress Notes (Signed)
Name: Brandon Diaz   MRN: [id number]    DOB: [birth year]   Date:05/11/2017       Progress Note  Subjective  Chief Complaint  Chief Complaint  Patient presents with  . Annual Exam    HPI  Pt. presents for Complete Physical Exam.  He had colonoscopy some years ago, will check records in older EMR if available.  He follows up with Urology for history of prostate cancer.  His labs were completed prior to the appointment, white count was lower than normal, Total and LDL cholesterol were elevated and he will started on Rosuvastatin.   Past Medical History:  Diagnosis Date  . Abnormal prostate specific antigen 12/01/2014  . CA of prostate ([place]) 12/01/2014   followed by urologist   . Decreased libido   . Erectile dysfunction 12/01/2014  . High cholesterol   . Prostate cancer [facility]) 2015   Dr. Erlene Quan    Past Surgical History:  Procedure Laterality Date  . COLONOSCOPY  2014   [facility]  . HERNIA REPAIR  4098   umilical     Family History  Problem Relation Age of Onset  . Stroke Mother   . Cancer Brother        pancreatic  . Kidney failure Father   . Prostate cancer Brother     Social History   Socioeconomic History  . Marital status: Married    Spouse name: Not on file  . Number of children: Not on file  . Years of education: Not on file  . Highest education level: Not on file  Social Needs  . Financial resource strain: Not on file  . Food insecurity - worry: Not on file  . Food insecurity - inability: Not on file  . Transportation needs - medical: Not on file  . Transportation needs - non-medical: Not on file  Occupational History  . Not on file  Tobacco Use  . Smoking status: Former Smoker    Last attempt to quit: 07/02/1985    Years since quitting: 31.8  . Smokeless tobacco: Never Used  Substance and Sexual Activity  . Alcohol use: No    Alcohol/week: 0.0 oz  . Drug use: No  . Sexual activity: Yes  Other Topics Concern  . Not on file  Social History  Narrative  . Not on file     Current Outpatient Medications:  .  aspirin 325 MG EC tablet, Take 325 mg by mouth daily., Disp: , Rfl:  .  gabapentin (NEURONTIN) 100 MG capsule, Take 1-2 capsules (100-200 mg total) daily by mouth. Take 100mg  each night for three days, then take 100mg  each morning and each night (Patient not taking: Reported on 05/11/2017), Disp: 30 capsule, Rfl: 0  Allergies  Allergen Reactions  . Ibuprofen Other (See Comments)  . Sudafed [Pseudoephedrine Hcl] Other (See Comments)     Review of Systems  Constitutional: Negative for chills, fever, malaise/fatigue and weight loss.  HENT: Negative for congestion, ear pain and sore throat.   Eyes: Negative for blurred vision and double vision.  Respiratory: Negative for cough, sputum production and shortness of breath.   Cardiovascular: Negative for chest pain, palpitations and leg swelling.  Gastrointestinal: Negative for abdominal pain, blood in stool, constipation, diarrhea, nausea and vomiting.  Genitourinary: Negative for dysuria and hematuria.  Musculoskeletal: Negative for back pain, joint pain and neck pain.  Skin: Negative for itching and rash.  Neurological: Negative for dizziness and headaches.  Psychiatric/Behavioral: Negative for depression. The patient is  not nervous/anxious and does not have insomnia.     Objective  Vitals:   05/11/17 1135  BP: 122/80  Pulse: 95  Resp: 16  Temp: 98.2 F (36.8 C)  TempSrc: Oral  SpO2: 95%  Weight: 189 lb 3.2 oz (85.8 kg)  Height: 5\' 7"  (1.702 m)    Physical Exam  Constitutional: He is oriented to person, place, and time and well-developed, well-nourished, and in no distress.  HENT:  Head: Normocephalic and atraumatic.  Right Ear: External ear normal.  Left Ear: External ear normal.  Mouth/Throat: Oropharynx is clear and moist.  Neck: Neck supple.  Cardiovascular: Normal rate, regular rhythm and normal heart sounds.  No murmur heard. Pulmonary/Chest:  Effort normal and breath sounds normal. He has no wheezes.  Abdominal: Soft. Bowel sounds are normal. There is no tenderness.  Musculoskeletal: He exhibits no edema.  Neurological: He is alert and oriented to person, place, and time.  Psychiatric: Mood, memory, affect and judgment normal.  Nursing note and vitals reviewed.        Assessment & Plan  1. Annual physical exam Reviewed age-appropriate labs, we'll check records from previous PCP regarding colonoscopy  2. Pure hypercholesterolemia Elevated total and LDL cholesterol mouth will start on low-dose Crestor 10 mg - rosuvastatin (CRESTOR) 10 MG tablet; Take 1 tablet (10 mg total) daily by mouth.  Dispense: 90 tablet; Refill: 0  3. Leukopenia, unspecified type  - CBC with Differential/Platelet  4. Need for hepatitis C screening test  - Hepatitis C antibody   Corina Stacy [facility] 05/11/2017 11:40 AM

## 2017-05-12 LAB — CBC WITH DIFFERENTIAL/PLATELET
BASOS ABS: 29 {cells}/uL (ref 0–200)
Basophils Relative: 0.9 %
EOS ABS: 38 {cells}/uL (ref 15–500)
Eosinophils Relative: 1.2 %
HEMATOCRIT: 44.3 % (ref 38.5–50.0)
HEMOGLOBIN: 15 g/dL (ref 13.2–17.1)
LYMPHS ABS: 1037 {cells}/uL (ref 850–3900)
MCH: 29.4 pg (ref 27.0–33.0)
MCHC: 33.9 g/dL (ref 32.0–36.0)
MCV: 86.9 fL (ref 80.0–100.0)
MPV: 11.1 fL (ref 7.5–12.5)
Monocytes Relative: 10 %
NEUTROS ABS: 1776 {cells}/uL (ref 1500–7800)
Neutrophils Relative %: 55.5 %
Platelets: 197 10*3/uL (ref 140–400)
RBC: 5.1 10*6/uL (ref 4.20–5.80)
RDW: 12.6 % (ref 11.0–15.0)
Total Lymphocyte: 32.4 %
WBC: 3.2 10*3/uL — ABNORMAL LOW (ref 3.8–10.8)
WBCMIX: 320 {cells}/uL (ref 200–950)

## 2017-05-12 LAB — HEPATITIS C ANTIBODY
HEP C AB: NONREACTIVE
SIGNAL TO CUT-OFF: 0.01 (ref <1.00)

## 2017-05-23 ENCOUNTER — Encounter: Payer: 59 | Admitting: Family Medicine

## 2017-05-25 ENCOUNTER — Telehealth: Payer: Self-pay | Admitting: Family Medicine

## 2017-05-25 NOTE — Telephone Encounter (Signed)
Copied from [place] [id number]. Topic: Quick Communication - Office Called Patient >> May 25, 2017  4:05 PM Cecelia Byars, [place] wrote: Patient returning call from [facility] please call he is off work,

## 2017-05-26 ENCOUNTER — Other Ambulatory Visit: Payer: Self-pay | Admitting: Family Medicine

## 2017-05-26 DIAGNOSIS — D72819 Decreased white blood cell count, unspecified: Secondary | ICD-10-CM

## 2017-05-26 NOTE — Telephone Encounter (Signed)
Pt notified of lab results

## 2017-06-07 NOTE — Progress Notes (Signed)
[place]  [phone] [phone]  ID: SKYE RODARTE OB: January 30, 1956  MR#: [id number]  EYC#:[id number]  Patient Care Team: Roselee Nova, MD as PCP - General (Family Medicine) Ashok Norris, MD (Family Medicine) Bary Castilla Forest Gleason, MD (General Surgery)  CHIEF COMPLAINT: Leukopenia.  INTERVAL HISTORY: Patient is a 61 year old male was noted to have a mild, yet persistent leukopenia.  He currently feels well and is asymptomatic.  No medications.  He has no recent fevers or illnesses.  Good appetite and denies weight loss.  He has no chest pain or shortness of breath.  He denies any nausea, vomiting, constipation, or diarrhea.  He has no urinary complaints.  Patient feels at his baseline and offers no specific complaints today.  REVIEW OF SYSTEMS:   Review of Systems  Constitutional: Negative.  Negative for fever, malaise/fatigue and weight loss.  Respiratory: Negative.  Negative for cough and shortness of breath.   Cardiovascular: Negative.  Negative for chest pain and leg swelling.  Gastrointestinal: Negative.  Negative for abdominal pain.  Genitourinary: Negative.  Negative for flank pain.  Musculoskeletal: Negative.   Skin: Negative.  Negative for rash.  Neurological: Negative.  Negative for sensory change, focal weakness and weakness.  Psychiatric/Behavioral: Negative.  The patient is not nervous/anxious.     As per HPI. Otherwise, a complete review of systems is negative.  PAST MEDICAL HISTORY: Past Medical History:  Diagnosis Date  . Abnormal prostate specific antigen 12/01/2014  . CA of prostate ([place]) 12/01/2014   followed by urologist   . Decreased libido   . Erectile dysfunction 12/01/2014  . High cholesterol   . Prostate cancer [facility]) 2015   Dr. Erlene Quan    PAST SURGICAL HISTORY: Past Surgical History:  Procedure Laterality Date  . COLONOSCOPY  2014   [facility]  . HERNIA REPAIR  1497   umilical     FAMILY  HISTORY: Family History  Problem Relation Age of Onset  . Stroke Mother   . Pancreatic cancer Brother   . Kidney failure Father   . Prostate cancer Brother     ADVANCED DIRECTIVES (Y/N):  N  HEALTH MAINTENANCE: Social History   Tobacco Use  . Smoking status: Former Smoker    Last attempt to quit: 07/02/1985    Years since quitting: 31.9  . Smokeless tobacco: Never Used  Substance Use Topics  . Alcohol use: No    Alcohol/week: 0.0 oz  . Drug use: No     Colonoscopy:  PAP:  Bone density:  Lipid panel:  Allergies  Allergen Reactions  . Ibuprofen Shortness Of Breath  . Sudafed [Pseudoephedrine Hcl] Shortness Of Breath    Shortness of breath    Current Outpatient Medications  Medication Sig Dispense Refill  . aspirin 325 MG EC tablet Take 325 mg by mouth daily.    . rosuvastatin (CRESTOR) 10 MG tablet Take 1 tablet (10 mg total) daily by mouth. 90 tablet 0   No current facility-administered medications for this visit.     OBJECTIVE: Vitals:   06/09/17 1127  BP: (!) 145/84  Pulse: 88  Resp: 18  Temp: 98.8 F (37.1 C)     Body mass index is 30.01 kg/m.    ECOG FS:0 - Asymptomatic  General: Well-developed, well-nourished, no acute distress. Eyes: Pink conjunctiva, anicteric sclera. HEENT: Normocephalic, moist mucous membranes, clear oropharnyx. Lungs: Clear to auscultation bilaterally. Heart: Regular rate and rhythm. No rubs, murmurs, or gallops. Abdomen: Soft, nontender, nondistended. No organomegaly noted,  normoactive bowel sounds. Musculoskeletal: No edema, cyanosis, or clubbing. Neuro: Alert, answering all questions appropriately. Cranial nerves grossly intact. Skin: No rashes or petechiae noted. Psych: Normal affect. Lymphatics: No cervical, calvicular, axillary or inguinal LAD.   LAB RESULTS:  Lab Results  Component Value Date   NA 142 05/08/2017   K 4.0 05/08/2017   CL 105 05/08/2017   CO2 31 05/08/2017   GLUCOSE 87 05/08/2017   BUN 14  05/08/2017   CREATININE 0.99 05/08/2017   CALCIUM 9.0 05/08/2017   PROT 6.5 05/08/2017   ALBUMIN 4.1 02/19/2016   AST 28 05/08/2017   ALT 40 05/08/2017   ALKPHOS 54 02/19/2016   BILITOT 0.4 05/08/2017   GFRNONAA 83 05/08/2017   GFRAA 96 05/08/2017    Lab Results  Component Value Date   WBC 3.2 (L) 06/09/2017   NEUTROABS 1.7 06/09/2017   HGB 15.6 06/09/2017   HCT 46.5 06/09/2017   MCV 87.8 06/09/2017   PLT 188 06/09/2017     STUDIES: No results found.  ASSESSMENT: Leukopenia  PLAN:    1. Leukopenia: Repeat laboratory work confirms his mild leukopenia.  All of his other laboratory work from today is either negative or within normal limits.  Peripheral blood flow cytometry and SPEP are pending at time of dictation.  No intervention is needed at this time.  Patient does not require bone marrow biopsy.  Return to clinic in 6 months for repeat laboratory work and further evaluation.  If patient's laboratory work remains stable or is improved and he continues to be asymptomatic, he likely can be discharged from clinic at that time.  Approximately 45 minutes was spent in discussion of which greater than 50% was consultation.  Patient expressed understanding and was in agreement with this plan. He also understands that He can call clinic at any time with any questions, concerns, or complaints.   Cancer Staging No matching staging information was found for the patient.  Lloyd Huger, MD   06/11/2017 1:47 PM

## 2017-06-09 ENCOUNTER — Inpatient Hospital Stay: Payer: 59

## 2017-06-09 ENCOUNTER — Inpatient Hospital Stay: Payer: 59 | Attending: Oncology | Admitting: Oncology

## 2017-06-09 ENCOUNTER — Encounter: Payer: Self-pay | Admitting: Oncology

## 2017-06-09 VITALS — BP 145/84 | HR 88 | Temp 98.8°F | Resp 18 | Ht 67.0 in | Wt 191.6 lb

## 2017-06-09 DIAGNOSIS — Z800 Family history of malignant neoplasm of digestive organs: Secondary | ICD-10-CM | POA: Diagnosis not present

## 2017-06-09 DIAGNOSIS — Z7982 Long term (current) use of aspirin: Secondary | ICD-10-CM | POA: Diagnosis not present

## 2017-06-09 DIAGNOSIS — Z8546 Personal history of malignant neoplasm of prostate: Secondary | ICD-10-CM | POA: Diagnosis not present

## 2017-06-09 DIAGNOSIS — Z87891 Personal history of nicotine dependence: Secondary | ICD-10-CM | POA: Insufficient documentation

## 2017-06-09 DIAGNOSIS — E7800 Pure hypercholesterolemia, unspecified: Secondary | ICD-10-CM | POA: Diagnosis not present

## 2017-06-09 DIAGNOSIS — D72819 Decreased white blood cell count, unspecified: Secondary | ICD-10-CM

## 2017-06-09 DIAGNOSIS — Z8042 Family history of malignant neoplasm of prostate: Secondary | ICD-10-CM | POA: Insufficient documentation

## 2017-06-09 DIAGNOSIS — Z79899 Other long term (current) drug therapy: Secondary | ICD-10-CM | POA: Insufficient documentation

## 2017-06-09 LAB — CBC WITH DIFFERENTIAL/PLATELET
Basophils Absolute: 0 K/uL (ref 0–0.1)
Basophils Relative: 1 %
Eosinophils Absolute: 0.1 K/uL (ref 0–0.7)
Eosinophils Relative: 2 %
HCT: 46.5 % (ref 40.0–52.0)
Hemoglobin: 15.6 g/dL (ref 13.0–18.0)
Lymphocytes Relative: 37 %
Lymphs Abs: 1.2 K/uL (ref 1.0–3.6)
MCH: 29.4 pg (ref 26.0–34.0)
MCHC: 33.5 g/dL (ref 32.0–36.0)
MCV: 87.8 fL (ref 80.0–100.0)
Monocytes Absolute: 0.2 K/uL (ref 0.2–1.0)
Monocytes Relative: 8 %
Neutro Abs: 1.7 K/uL (ref 1.4–6.5)
Neutrophils Relative %: 52 %
Platelets: 188 K/uL (ref 150–440)
RBC: 5.3 MIL/uL (ref 4.40–5.90)
RDW: 13.2 % (ref 11.5–14.5)
WBC: 3.2 K/uL — ABNORMAL LOW (ref 3.8–10.6)

## 2017-06-09 LAB — IRON AND TIBC
Iron: 66 ug/dL (ref 45–182)
Saturation Ratios: 19 % (ref 17.9–39.5)
TIBC: 351 ug/dL (ref 250–450)
UIBC: 285 ug/dL

## 2017-06-09 LAB — FERRITIN: Ferritin: 246 ng/mL (ref 24–336)

## 2017-06-12 LAB — PROTEIN ELECTROPHORESIS, SERUM
A/G Ratio: 1.1 (ref 0.7–1.7)
ALPHA-2-GLOBULIN: 0.7 g/dL (ref 0.4–1.0)
Albumin ELP: 3.5 g/dL (ref 2.9–4.4)
Alpha-1-Globulin: 0.2 g/dL (ref 0.0–0.4)
Beta Globulin: 1.3 g/dL (ref 0.7–1.3)
GAMMA GLOBULIN: 1.1 g/dL (ref 0.4–1.8)
GLOBULIN, TOTAL: 3.3 g/dL (ref 2.2–3.9)
TOTAL PROTEIN ELP: 6.8 g/dL (ref 6.0–8.5)

## 2017-06-12 LAB — COMP PANEL: LEUKEMIA/LYMPHOMA

## 2017-06-23 LAB — NEUTROPHIL AB TEST LEVEL 1: NEUTROPHIL SCR/PANEL INTERP.: NEGATIVE

## 2017-06-30 ENCOUNTER — Other Ambulatory Visit: Payer: Self-pay

## 2017-06-30 ENCOUNTER — Ambulatory Visit
Admission: EM | Admit: 2017-06-30 | Discharge: 2017-06-30 | Disposition: A | Discharge status: Home / Self Care | Payer: 59 | Attending: Family Medicine | Admitting: Family Medicine

## 2017-06-30 DIAGNOSIS — R05 Cough: Secondary | ICD-10-CM

## 2017-06-30 DIAGNOSIS — R059 Cough, unspecified: Secondary | ICD-10-CM

## 2017-06-30 DIAGNOSIS — J0110 Acute frontal sinusitis, unspecified: Secondary | ICD-10-CM

## 2017-06-30 MED ORDER — BENZONATATE 200 MG PO CAPS: 200.0000 mg | ORAL_CAPSULE | Freq: Three times a day (TID) | ORAL | 0 refills | Status: DC | PRN

## 2017-06-30 MED ORDER — AMOXICILLIN 875 MG PO TABS: 875.0000 mg | ORAL_TABLET | Freq: Two times a day (BID) | ORAL | 0 refills | Status: DC

## 2017-06-30 NOTE — ED Triage Notes (Signed)
Pt with nasal drainage and non-productive cough x past few weeks. Denies fever. Denies pain

## 2017-06-30 NOTE — ED Provider Notes (Signed)
[facility]    CSN: [id number] Arrival date & time: 06/30/17  1020     History   Chief Complaint Chief Complaint  Patient presents with  . Nasal Congestion    HPI Brandon Diaz is a 62 y.o. male.   The history is provided by the patient.  URI  Presenting symptoms: congestion, cough and rhinorrhea   Severity:  Moderate Onset quality:  Sudden Duration:  3 weeks Timing:  Constant Progression:  Worsening Chronicity:  New Relieved by:  Nothing Ineffective treatments:  OTC medications Associated symptoms: headaches and sinus pain   Risk factors: not elderly, no chronic cardiac disease, no chronic kidney disease, no chronic respiratory disease, no diabetes mellitus, no immunosuppression, no recent illness and no recent travel     Past Medical History:  Diagnosis Date  . Abnormal prostate specific antigen 12/01/2014  . CA of prostate ([place]) 12/01/2014   followed by urologist   . Decreased libido   . Erectile dysfunction 12/01/2014  . High cholesterol   . Prostate cancer [facility]) 2015   Dr. Erlene Quan    Patient Active Problem List   Diagnosis Date Noted  . Leukopenia 05/26/2017  . 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 Productive cough 03/29/2016  . History of umbilical hernia repair 78/29/5621  . Abnormal prostate specific antigen 12/01/2014  . CA of prostate ([place]) 12/01/2014  . Screening for human immunodeficiency virus 12/01/2014  . Erectile dysfunction 12/01/2014  . HLD (hyperlipidemia) 03/26/2010  . Decreased libido 07/16/2008    Past Surgical History:  Procedure Laterality Date  . COLONOSCOPY  2014   [facility]  . HERNIA REPAIR  3086   umilical        Home Medications    Prior to Admission medications   Medication Sig Start Date End Date Taking? Authorizing Provider  amoxicillin (AMOXIL) 875 MG tablet Take 1 tablet (875 mg total) by mouth 2 (two) times daily. 06/30/17   Norval Gable, MD  aspirin 325 MG EC tablet Take 325 mg by mouth  daily.    [provider]  benzonatate (TESSALON) 200 MG capsule Take 1 capsule (200 mg total) by mouth 3 (three) times daily as needed. 06/30/17   Norval Gable, MD  rosuvastatin (CRESTOR) 10 MG tablet Take 1 tablet (10 mg total) daily by mouth. 05/11/17   Roselee Nova, MD    Family History Family History  Problem Relation Age of Onset  . Stroke Mother   . Pancreatic cancer Brother   . Kidney failure Father   . Prostate cancer Brother     Social History Social History   Tobacco Use  . Smoking status: Former Smoker    Last attempt to quit: 07/02/1985    Years since quitting: 32.0  . Smokeless tobacco: Never Used  Substance Use Topics  . Alcohol use: No    Alcohol/week: 0.0 oz  . Drug use: No     Allergies   Ibuprofen and Sudafed [pseudoephedrine hcl]   Review of Systems Review of Systems  HENT: Positive for congestion, rhinorrhea and sinus pain.   Respiratory: Positive for cough.   Neurological: Positive for headaches.     Physical Exam Triage Vital Signs ED Triage Vitals  Enc Vitals Group     BP 06/30/17 1034 130/89     Pulse Rate 06/30/17 1034 92     Resp 06/30/17 1034 20     Temp 06/30/17 1034 98.7 F (37.1 C)     Temp Source 06/30/17  1034 Oral     SpO2 06/30/17 1034 97 %     Weight 06/30/17 1035 190 lb (86.2 kg)     Height 06/30/17 1035 5\' 7"  (1.702 m)     Head Circumference --      Peak Flow --      Pain Score 06/30/17 1035 0     Pain Loc --      Pain Edu? --      Excl. in [place]? --    No data found.  Updated Vital Signs BP 130/89 (BP Location: Left Arm)   Pulse 92   Temp 98.7 F (37.1 C) (Oral)   Resp 20   Ht 5\' 7"  (1.702 m)   Wt 190 lb (86.2 kg)   SpO2 97%   BMI 29.76 kg/m   Visual Acuity Right Eye Distance:   Left Eye Distance:   Bilateral Distance:    Right Eye Near:   Left Eye Near:    Bilateral Near:     Physical Exam  Constitutional: He appears well-developed and well-nourished. No distress.  HENT:  Head:  Normocephalic and atraumatic.  Right Ear: Tympanic membrane, external ear and ear canal normal.  Left Ear: Tympanic membrane, external ear and ear canal normal.  Nose: Mucosal edema and rhinorrhea present. Right sinus exhibits frontal sinus tenderness. Left sinus exhibits frontal sinus tenderness.  Mouth/Throat: Uvula is midline and mucous membranes are normal. Posterior oropharyngeal erythema present. No oropharyngeal exudate, posterior oropharyngeal edema or tonsillar abscesses. No tonsillar exudate.  Eyes: Conjunctivae and EOM are normal. Pupils are equal, round, and reactive to light. Right eye exhibits no discharge. Left eye exhibits no discharge. No scleral icterus.  Neck: Normal range of motion. Neck supple. No tracheal deviation present. No thyromegaly present.  Cardiovascular: Normal rate, regular rhythm and normal heart sounds.  Pulmonary/Chest: Effort normal and breath sounds normal. No stridor. No respiratory distress. He has no wheezes. He has no rales. He exhibits no tenderness.  Lymphadenopathy:    He has no cervical adenopathy.  Neurological: He is alert.  Skin: Skin is warm and dry. No rash noted. He is not diaphoretic.  Nursing note and vitals reviewed.    UC Treatments / Results  Labs (all labs ordered are listed, but only abnormal results are displayed) Labs Reviewed - No data to display  EKG  EKG Interpretation None       Radiology No results found.  Procedures Procedures (including critical care time)  Medications Ordered in UC Medications - No data to display   Initial Impression / Assessment and Plan / UC Course  I have reviewed the triage vital signs and the nursing notes.  Pertinent labs & imaging results that were available during my care of the patient were reviewed by me and considered in my medical decision making (see chart for details).       Final Clinical Impressions(s) / UC Diagnoses   Final diagnoses:  Acute frontal sinusitis,  recurrence not specified  Cough    ED Discharge Orders        Ordered    amoxicillin (AMOXIL) 875 MG tablet  2 times daily     06/30/17 1121    benzonatate (TESSALON) 200 MG capsule  3 times daily PRN     06/30/17 1121     1. diagnosis reviewed with patient 2. rx as per orders above; reviewed possible side effects, interactions, risks and benefits  3. Recommend supportive treatment with otc flonase 4. Follow-up prn if symptoms worsen  or don't improve  Controlled Substance Prescriptions [place] Controlled Substance Registry consulted? Not applicable   Norval Gable, MD 06/30/17 1128

## 2017-07-21 ENCOUNTER — Other Ambulatory Visit: Payer: Self-pay

## 2017-07-21 ENCOUNTER — Encounter: Payer: Self-pay | Admitting: Urology

## 2017-07-21 ENCOUNTER — Ambulatory Visit
Admission: RE | Admit: 2017-07-21 | Discharge: 2017-07-21 | Disposition: A | Discharge status: Home / Self Care | Payer: 59 | Source: Ambulatory Visit | Attending: Urology | Admitting: Urology

## 2017-07-21 ENCOUNTER — Ambulatory Visit (INDEPENDENT_AMBULATORY_CARE_PROVIDER_SITE_OTHER): Payer: 59 | Admitting: Urology

## 2017-07-21 VITALS — BP 150/83 | HR 80 | Ht 67.0 in | Wt 190.0 lb

## 2017-07-21 DIAGNOSIS — C61 Malignant neoplasm of prostate: Secondary | ICD-10-CM | POA: Diagnosis not present

## 2017-07-21 NOTE — Addendum Note (Signed)
Addended by: Elmo Putt on: 07/21/2017 03:44 PM   Modules accepted: Orders

## 2017-07-21 NOTE — Progress Notes (Signed)
4:17 PM  07/21/17  Brandon Diaz [birth year] [id number]  Referring provider: Ashok Norris, MD No address on file  Chief Complaint  Patient presents with  . Prostate Cancer    60month    HPI: 62 yo M with rising PSA to 3.1 (from 2.6) ng/ dL s/p prostate biopsy on 04/28/14. His initial pathology results showed Gleason 3+3 prostate cancer in 3/12 cores, 2 on left (lateral base/ lateral mid) and right apex involving 6-27% of each core. DRE unremarkable (cT1c), TRUS vol 37 cc.   He underwent routine confirmatory biopsy on 08/2014 which showed Gleason 3+3 in 2/12 cores bilateral involving only 1% of each core.  He also has a strong family history of prostate cancer, brother dx and treated around his current same age who underwent radical prostatectomy.  No voiding symptoms.   His most recent PSA was up slightly to 4.2 on 718.  Today's PSA is pending.  He does have some mild baseline erectile dysfunction. He has been using Viagra with good result.   PMH: Past Medical History:  Diagnosis Date  . Abnormal prostate specific antigen 12/01/2014  . CA of prostate ([place]) 12/01/2014   followed by urologist   . Decreased libido   . Erectile dysfunction 12/01/2014  . High cholesterol   . Prostate cancer [facility]) 2015   Dr. Erlene Quan    Surgical History: Past Surgical History:  Procedure Laterality Date  . COLONOSCOPY  2014   [facility]  . HERNIA REPAIR  [id number]   umilical     Home Medications:  Allergies as of 07/21/2017      Reactions   Ibuprofen Shortness Of Breath   Sudafed [pseudoephedrine Hcl] Shortness Of Breath   Shortness of breath      Medication List        Accurate as of 07/21/17  4:17 PM. Always use your most recent med list.          aspirin 325 MG EC tablet Take 325 mg by mouth daily.   rosuvastatin 10 MG tablet Commonly known as:  CRESTOR Take 1 tablet (10 mg total) daily by mouth.       Allergies:  Allergies  Allergen Reactions  . Ibuprofen Shortness  Of Breath  . Sudafed [Pseudoephedrine Hcl] Shortness Of Breath    Shortness of breath    Family History: Family History  Problem Relation Age of Onset  . Stroke Mother   . Pancreatic cancer Brother   . Kidney failure Father   . Prostate cancer Brother     Social History:  reports that he quit smoking about 32 years ago. he has never used smokeless tobacco. He reports that he does not drink alcohol or use drugs.  ROS: UROLOGY Frequent Urination?: No Hard to postpone urination?: No Burning/pain with urination?: No Get up at night to urinate?: No Leakage of urine?: No Urine stream starts and stops?: No Trouble starting stream?: No Do you have to strain to urinate?: No Blood in urine?: No Urinary tract infection?: No Sexually transmitted disease?: No Injury to kidneys or bladder?: No Painful intercourse?: No Weak stream?: No Erection problems?: No Penile pain?: No  Gastrointestinal Nausea?: No Vomiting?: No Indigestion/heartburn?: No Diarrhea?: No Constipation?: No  Constitutional Fever: No Night sweats?: No Weight loss?: No Fatigue?: No  Skin Skin rash/lesions?: No Itching?: No  Eyes Blurred vision?: No Double vision?: No  Ears/Nose/Throat Sore throat?: No Sinus problems?: No  Hematologic/Lymphatic Swollen glands?: No Easy bruising?: No  Cardiovascular Leg swelling?: No  Chest pain?: No  Respiratory Cough?: No Shortness of breath?: No  Endocrine Excessive thirst?: No  Musculoskeletal Back pain?: No Joint pain?: No  Neurological Headaches?: No Dizziness?: No  Psychologic Depression?: No Anxiety?: No  Physical Exam: BP (!) 150/83   Pulse 80   Ht 5\' 7"  (1.702 m)   Wt 190 lb (86.2 kg)   BMI 29.76 kg/m   Constitutional:  Alert and oriented, No acute distress. HEENT: [place] AT, moist mucus membranes.  Trachea midline, no masses. Cardiovascular: No clubbing, cyanosis, or edema. Respiratory: Normal respiratory effort, no increased work  of breathing. GI: Abdomen is soft, nontender, nondistended, no abdominal masses GU: No CVA tenderness.  Rectal exam: Normal sphincter tone.  No nodules.  Nontender prostate.  40 g prostrate.   Skin: No rashes, bruises or suspicious lesions. Neurologic: Grossly intact, no focal deficits, moving all 4 extremities. Psychiatric: Normal mood and affect.  Laboratory Data: Lab Results  Component Value Date   WBC 3.2 (L) 06/09/2017   HGB 15.6 06/09/2017   HCT 46.5 06/09/2017   MCV 87.8 06/09/2017   PLT 188 06/09/2017    Lab Results  Component Value Date   CREATININE 0.99 05/08/2017     Component     Latest Ref Rng & Units 02/06/2015 07/17/2015 01/22/2016 01/19/2017  Prostate Specific Ag, Serum     0.0 - 4.0 ng/mL 3.5 3.3 3.7 4.2 (H)    Assessment & Plan:   62 yo AAM dx cT1c Gleason 3+3 prostate cancer in 3/13 cores, iPSA 3.1 with family history of prostate cancer, DRE unremarkable, TRUS vol 37 g. Dx 04/2014.    Repeat biopsy 08/2014, Gleason 3+3 in 2/12 cores, only 1% each core.  1. Prostate cancer PSA only in 6 months Plan for repeat PSA/DRE in 12 months Will call with PSA from today - PSA   2. Erectile dysfunction Viagra 50 mg prn, works well  F/u as above  Hollice Espy, MD  [place] [phone]

## 2017-07-22 LAB — PSA: PROSTATIC SPECIFIC ANTIGEN: 4.46 ng/mL — AB (ref 0.00–4.00)

## 2017-07-26 ENCOUNTER — Telehealth: Payer: Self-pay

## 2017-07-26 NOTE — Telephone Encounter (Signed)
LMOM

## 2017-07-26 NOTE — Telephone Encounter (Signed)
-----   Message from Hollice Espy, MD sent at 07/24/2017  3:24 PM EST ----- Please let Mr. Dombek know that his PSA was up again slightly to 4.4 from 4.2.  Plan to repeat this in 6 months.  At that point, if it continues to rise, would recommend endorectal MRI.  After having his labs drawn in 6 months, if he does not hear back from me about the results within 1 week, please call our office to ensure that I have seen the results and that we have a plan.  Hollice Espy, MD

## 2017-07-27 NOTE — Telephone Encounter (Signed)
Angie gave pt information.

## 2017-12-03 NOTE — Progress Notes (Signed)
[facility]  [phone] [phone]  ID: CONARD ALVIRA OB: [birth year]  MR#: [id number]  ITG#:[id number]  Patient Care Team: Roselee Nova, MD as PCP - General (Family Medicine) Ashok Norris, MD (Family Medicine) Bary Castilla Forest Gleason, MD (General Surgery)  CHIEF COMPLAINT: Leukopenia.  INTERVAL HISTORY: Patient returns to clinic today for further evaluation and repeat laboratory work.  He continues to feel well and remains asymptomatic.  He denies any recent fevers or illnesses. He has no neurologic complaints.  He denies any chest pain or shortness of breath.  He has no nausea, vomiting, constipation, or diarrhea.  He has no urinary complaints.  Patient offers no specific complaints today.  REVIEW OF SYSTEMS:   Review of Systems  Constitutional: Negative.  Negative for fever, malaise/fatigue and weight loss.  Respiratory: Negative.  Negative for cough and shortness of breath.   Cardiovascular: Negative.  Negative for chest pain and leg swelling.  Gastrointestinal: Negative.  Negative for abdominal pain.  Genitourinary: Negative.  Negative for flank pain.  Musculoskeletal: Negative.   Skin: Negative.  Negative for rash.  Neurological: Negative.  Negative for sensory change, focal weakness and weakness.  Psychiatric/Behavioral: Negative.  The patient is not nervous/anxious.     As per HPI. Otherwise, a complete review of systems is negative.  PAST MEDICAL HISTORY: Past Medical History:  Diagnosis Date  . Abnormal prostate specific antigen 12/01/2014  . CA of prostate ([place]) 12/01/2014   followed by urologist   . Decreased libido   . Erectile dysfunction 12/01/2014  . High cholesterol   . Prostate cancer [facility]) 2015   Dr. Erlene Quan    PAST SURGICAL HISTORY: Past Surgical History:  Procedure Laterality Date  . COLONOSCOPY  2014   [facility]  . HERNIA REPAIR  [id number]   umilical     FAMILY HISTORY: Family History  Problem Relation Age of  Onset  . Stroke Mother   . Pancreatic cancer Brother   . Kidney failure Father   . Prostate cancer Brother     ADVANCED DIRECTIVES (Y/N):  N  HEALTH MAINTENANCE: Social History   Tobacco Use  . Smoking status: Former Smoker    Last attempt to quit: 07/02/1985    Years since quitting: 32.4  . Smokeless tobacco: Never Used  Substance Use Topics  . Alcohol use: No    Alcohol/week: 0.0 oz  . Drug use: No     Colonoscopy:  PAP:  Bone density:  Lipid panel:  Allergies  Allergen Reactions  . Ibuprofen Shortness Of Breath  . Sudafed [Pseudoephedrine Hcl] Shortness Of Breath    Shortness of breath    Current Outpatient Medications  Medication Sig Dispense Refill  . aspirin 325 MG EC tablet Take 325 mg by mouth daily.    . rosuvastatin (CRESTOR) 10 MG tablet Take 1 tablet (10 mg total) daily by mouth. 90 tablet 0   No current facility-administered medications for this visit.     OBJECTIVE: Vitals:   12/08/17 1124  BP: (!) 158/89  Pulse: 87  Resp: 18  Temp: (!) 96.6 F (35.9 C)     Body mass index is 29.9 kg/m.    ECOG FS:0 - Asymptomatic  General: Well-developed, well-nourished, no acute distress. Eyes: Pink conjunctiva, anicteric sclera. Lungs: Clear to auscultation bilaterally. Heart: Regular rate and rhythm. No rubs, murmurs, or gallops. Abdomen: Soft, nontender, nondistended. No organomegaly noted, normoactive bowel sounds. Musculoskeletal: No edema, cyanosis, or clubbing. Neuro: Alert, answering all questions appropriately. Cranial nerves  grossly intact. Skin: No rashes or petechiae noted. Psych: Normal affect.  LAB RESULTS:  Lab Results  Component Value Date   NA 142 05/08/2017   K 4.0 05/08/2017   CL 105 05/08/2017   CO2 31 05/08/2017   GLUCOSE 87 05/08/2017   BUN 14 05/08/2017   CREATININE 0.99 05/08/2017   CALCIUM 9.0 05/08/2017   PROT 6.5 05/08/2017   ALBUMIN 4.1 02/19/2016   AST 28 05/08/2017   ALT 40 05/08/2017   ALKPHOS 54 02/19/2016    BILITOT 0.4 05/08/2017   GFRNONAA 83 05/08/2017   GFRAA 96 05/08/2017    Lab Results  Component Value Date   WBC 3.5 (L) 12/08/2017   NEUTROABS 1.5 12/08/2017   HGB 15.8 12/08/2017   HCT 47.5 12/08/2017   MCV 87.0 12/08/2017   PLT 169 12/08/2017     STUDIES: No results found.  ASSESSMENT: Leukopenia  PLAN:    1. Leukopenia: Repeat laboratory work from today revealed a persistent leukopenia.  Previously all of his other laboratory work including peripheral blood flow cytometry and SPEP are either negative or within normal limits.  No intervention is needed.  Patient does not require bone marrow biopsy.  After discussion with the patient, is agreed upon that no further follow-up is necessary.  Please refer patient back if there are any questions or concerns.   I spent a total of 20 minutes face-to-face with the patient of which greater than 50% of the visit was spent in counseling and coordination of care as summated above.  Patient expressed understanding and was in agreement with this plan. He also understands that He can call clinic at any time with any questions, concerns, or complaints.   Lloyd Huger, MD   12/12/2017 10:41 PM

## 2017-12-08 ENCOUNTER — Other Ambulatory Visit: Payer: Self-pay

## 2017-12-08 ENCOUNTER — Inpatient Hospital Stay: Payer: 59

## 2017-12-08 ENCOUNTER — Inpatient Hospital Stay: Payer: 59 | Attending: Oncology | Admitting: Oncology

## 2017-12-08 VITALS — BP 158/89 | HR 87 | Temp 96.6°F | Resp 18 | Wt 190.9 lb

## 2017-12-08 DIAGNOSIS — Z87891 Personal history of nicotine dependence: Secondary | ICD-10-CM | POA: Diagnosis not present

## 2017-12-08 DIAGNOSIS — D72819 Decreased white blood cell count, unspecified: Secondary | ICD-10-CM | POA: Diagnosis not present

## 2017-12-08 DIAGNOSIS — Z8546 Personal history of malignant neoplasm of prostate: Secondary | ICD-10-CM | POA: Diagnosis not present

## 2017-12-08 LAB — CBC WITH DIFFERENTIAL/PLATELET
BASOS PCT: 1 %
Basophils Absolute: 0 10*3/uL (ref 0–0.1)
EOS ABS: 0.1 10*3/uL (ref 0–0.7)
EOS PCT: 3 %
HCT: 47.5 % (ref 40.0–52.0)
Hemoglobin: 15.8 g/dL (ref 13.0–18.0)
LYMPHS ABS: 1.6 10*3/uL (ref 1.0–3.6)
Lymphocytes Relative: 44 %
MCH: 29 pg (ref 26.0–34.0)
MCHC: 33.3 g/dL (ref 32.0–36.0)
MCV: 87 fL (ref 80.0–100.0)
MONO ABS: 0.3 10*3/uL (ref 0.2–1.0)
MONOS PCT: 8 %
Neutro Abs: 1.5 10*3/uL (ref 1.4–6.5)
Neutrophils Relative %: 44 %
PLATELETS: 169 10*3/uL (ref 150–440)
RBC: 5.46 MIL/uL (ref 4.40–5.90)
RDW: 13.6 % (ref 11.5–14.5)
WBC: 3.5 10*3/uL — ABNORMAL LOW (ref 3.8–10.6)

## 2017-12-08 NOTE — Progress Notes (Signed)
Here for follow up.stated overall feeling well.

## 2018-01-17 ENCOUNTER — Other Ambulatory Visit: Payer: 59

## 2018-01-17 DIAGNOSIS — C61 Malignant neoplasm of prostate: Secondary | ICD-10-CM

## 2018-01-18 LAB — PSA: Prostate Specific Ag, Serum: 4.6 ng/mL — ABNORMAL HIGH (ref 0.0–4.0)

## 2018-03-26 ENCOUNTER — Other Ambulatory Visit: Payer: Self-pay

## 2018-03-26 ENCOUNTER — Emergency Department: Payer: 59

## 2018-03-26 ENCOUNTER — Emergency Department
Admission: EM | Admit: 2018-03-26 | Discharge: 2018-03-26 | Disposition: A | Discharge status: Home / Self Care | Payer: 59 | Attending: Student in an Organized Health Care Education/Training Program | Admitting: Student in an Organized Health Care Education/Training Program

## 2018-03-26 DIAGNOSIS — Z8546 Personal history of malignant neoplasm of prostate: Secondary | ICD-10-CM | POA: Diagnosis not present

## 2018-03-26 DIAGNOSIS — L509 Urticaria, unspecified: Secondary | ICD-10-CM | POA: Diagnosis not present

## 2018-03-26 DIAGNOSIS — T7840XA Allergy, unspecified, initial encounter: Secondary | ICD-10-CM | POA: Diagnosis not present

## 2018-03-26 DIAGNOSIS — I82401 Acute embolism and thrombosis of unspecified deep veins of right lower extremity: Secondary | ICD-10-CM | POA: Diagnosis not present

## 2018-03-26 DIAGNOSIS — L299 Pruritus, unspecified: Secondary | ICD-10-CM | POA: Diagnosis not present

## 2018-03-26 DIAGNOSIS — I82431 Acute embolism and thrombosis of right popliteal vein: Secondary | ICD-10-CM | POA: Diagnosis not present

## 2018-03-26 DIAGNOSIS — T783XXA Angioneurotic edema, initial encounter: Secondary | ICD-10-CM | POA: Diagnosis not present

## 2018-03-26 DIAGNOSIS — Z7982 Long term (current) use of aspirin: Secondary | ICD-10-CM | POA: Diagnosis not present

## 2018-03-26 DIAGNOSIS — M79604 Pain in right leg: Secondary | ICD-10-CM | POA: Diagnosis not present

## 2018-03-26 DIAGNOSIS — Z87891 Personal history of nicotine dependence: Secondary | ICD-10-CM | POA: Diagnosis not present

## 2018-03-26 DIAGNOSIS — Z79899 Other long term (current) drug therapy: Secondary | ICD-10-CM | POA: Insufficient documentation

## 2018-03-26 DIAGNOSIS — R0602 Shortness of breath: Secondary | ICD-10-CM | POA: Diagnosis not present

## 2018-03-26 DIAGNOSIS — I824Z1 Acute embolism and thrombosis of unspecified deep veins of right distal lower extremity: Secondary | ICD-10-CM | POA: Diagnosis not present

## 2018-03-26 DIAGNOSIS — R600 Localized edema: Secondary | ICD-10-CM | POA: Diagnosis not present

## 2018-03-26 LAB — CBC WITH DIFFERENTIAL/PLATELET
BASOS ABS: 0 10*3/uL (ref 0–0.1)
Basophils Relative: 1 %
EOS PCT: 3 %
Eosinophils Absolute: 0.1 10*3/uL (ref 0–0.7)
HCT: 43 % (ref 40.0–52.0)
Hemoglobin: 14.9 g/dL (ref 13.0–18.0)
LYMPHS ABS: 1.3 10*3/uL (ref 1.0–3.6)
LYMPHS PCT: 37 %
MCH: 30.7 pg (ref 26.0–34.0)
MCHC: 34.6 g/dL (ref 32.0–36.0)
MCV: 88.7 fL (ref 80.0–100.0)
MONO ABS: 0.3 10*3/uL (ref 0.2–1.0)
Monocytes Relative: 9 %
Neutro Abs: 1.9 10*3/uL (ref 1.4–6.5)
Neutrophils Relative %: 50 %
PLATELETS: 182 10*3/uL (ref 150–440)
RBC: 4.85 MIL/uL (ref 4.40–5.90)
RDW: 13.7 % (ref 11.5–14.5)
WBC: 3.7 10*3/uL — ABNORMAL LOW (ref 3.8–10.6)

## 2018-03-26 LAB — BASIC METABOLIC PANEL
ANION GAP: 5 (ref 5–15)
BUN: 19 mg/dL (ref 6–20)
CALCIUM: 8.8 mg/dL — AB (ref 8.9–10.3)
CO2: 27 mmol/L (ref 22–32)
Chloride: 106 mmol/L (ref 98–111)
Creatinine, Ser: 0.91 mg/dL (ref 0.61–1.24)
GFR calc Af Amer: >60 mL/min (ref >60)
GLUCOSE: 97 mg/dL (ref 70–99)
Potassium: 3.9 mmol/L (ref 3.5–5.1)
SODIUM: 138 mmol/L (ref 135–145)

## 2018-03-26 MED ORDER — ELIQUIS 5 MG VTE STARTER PACK: ORAL_TABLET | ORAL | 0 refills | Status: DC

## 2018-03-26 MED ORDER — APIXABAN 5 MG PO TABS
10.0000 mg | ORAL_TABLET | Freq: Two times a day (BID) | ORAL | Status: DC
  Administered 2018-03-26: 10 mg via ORAL
  Filled 2018-03-26: qty 2

## 2018-03-26 NOTE — ED Triage Notes (Signed)
Pt c/o right calf pain for the past 3 days. Denies any swelling.

## 2018-03-26 NOTE — ED Provider Notes (Signed)
[facility] Emergency Department Provider Note    First MD Initiated Contact with Patient 03/26/18 1234     (approximate)  I have reviewed the triage vital signs and the nursing notes.   HISTORY  Chief Complaint Leg Pain    HPI Brandon Diaz is a 62 y.o. male history of prostate cancer presents to the ER for right calf pain.  States that the calf pain is been going on for several days.  Describes as a mild to moderate achiness.  No fevers.  No chest pain or shortness of breath.  No other symptoms.  Denies any trauma or recent prolonged travel.  Not currently undergoing any chemotherapy.  Not currently on any blood thinners.    Past Medical History:  Diagnosis Date  . Abnormal prostate specific antigen 12/01/2014  . CA of prostate ([place]) 12/01/2014   followed by urologist   . Decreased libido   . Erectile dysfunction 12/01/2014  . High cholesterol   . Prostate cancer [facility]) 2015   Dr. Erlene Quan   Family History  Problem Relation Age of Onset  . Stroke Mother   . Pancreatic cancer Brother   . Kidney failure Father   . Prostate cancer Brother    Past Surgical History:  Procedure Laterality Date  . COLONOSCOPY  2014   [facility]  . HERNIA REPAIR  0998   umilical    Patient Active Problem List   Diagnosis Date Noted  . Leukopenia 05/26/2017  . 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 Productive cough 03/29/2016  . History of umbilical hernia repair 33/82/5053  . Abnormal prostate specific antigen 12/01/2014  . CA of prostate ([place]) 12/01/2014  . Screening for human immunodeficiency virus 12/01/2014  . Erectile dysfunction 12/01/2014  . HLD (hyperlipidemia) 03/26/2010  . Decreased libido 07/16/2008      Prior to Admission medications   Medication Sig Start Date End Date Taking? Authorizing Provider  aspirin 325 MG EC tablet Take 325 mg by mouth daily.    [provider]  ELIQUIS STARTER PACK (ELIQUIS STARTER PACK) 5 MG TABS Take  as directed on package: start with two-5mg  tablets twice daily for 7 days. On day 8, switch to one-5mg  tablet twice daily. 03/26/18   Merlyn Lot, MD  rosuvastatin (CRESTOR) 10 MG tablet Take 1 tablet (10 mg total) daily by mouth. 05/11/17   Roselee Nova, MD    Allergies Ibuprofen and Sudafed [pseudoephedrine hcl]    Social History Social History   Tobacco Use  . Smoking status: Former Smoker    Last attempt to quit: 07/02/1985    Years since quitting: 32.7  . Smokeless tobacco: Never Used  Substance Use Topics  . Alcohol use: No    Alcohol/week: 0.0 standard drinks  . Drug use: No    Review of Systems Patient denies headaches, rhinorrhea, blurry vision, numbness, shortness of breath, chest pain, edema, cough, abdominal pain, nausea, vomiting, diarrhea, dysuria, fevers, rashes or hallucinations unless otherwise stated above in HPI. ____________________________________________   PHYSICAL EXAM:  VITAL SIGNS: Vitals:   03/26/18 1254 03/26/18 1315  BP: 139/70 123/77  Pulse: 63 (!) 56  Resp:  18  Temp:    SpO2: 99% 100%    Constitutional: Alert and oriented.  Eyes: Conjunctivae are normal.  Head: Atraumatic. Nose: No congestion/rhinnorhea. Mouth/Throat: Mucous membranes are moist.   Neck: No stridor. Painless ROM.  Cardiovascular: Normal rate, regular rhythm. Grossly normal heart sounds.  Good peripheral circulation. Respiratory: Normal  respiratory effort.  No retractions. Lungs CTAB. Gastrointestinal: Soft and nontender. No distention. No abdominal bruits. No CVA tenderness. Genitourinary:  Musculoskeletal: No lower extremity tenderness nor edema.  No joint effusions. Neurologic:  Normal speech and language. No gross focal neurologic deficits are appreciated. No facial droop Skin:  Skin is warm, dry and intact. No rash noted. Psychiatric: Mood and affect are normal. Speech and behavior are normal.  ____________________________________________   LABS (all  labs ordered are listed, but only abnormal results are displayed)  Results for orders placed or performed during the hospital encounter of 03/26/18 (from the past 24 hour(s))  CBC with Differential/Platelet     Status: Abnormal   Collection Time: 03/26/18  1:04 PM  Result Value Ref Range   WBC 3.7 (L) 3.8 - 10.6 K/uL   RBC 4.85 4.40 - 5.90 MIL/uL   Hemoglobin 14.9 13.0 - 18.0 g/dL   HCT 43.0 40.0 - 52.0 %   MCV 88.7 80.0 - 100.0 fL   MCH 30.7 26.0 - 34.0 pg   MCHC 34.6 32.0 - 36.0 g/dL   RDW 13.7 11.5 - 14.5 %   Platelets 182 150 - 440 K/uL   Neutrophils Relative % 50 %   Neutro Abs 1.9 1.4 - 6.5 K/uL   Lymphocytes Relative 37 %   Lymphs Abs 1.3 1.0 - 3.6 K/uL   Monocytes Relative 9 %   Monocytes Absolute 0.3 0.2 - 1.0 K/uL   Eosinophils Relative 3 %   Eosinophils Absolute 0.1 0 - 0.7 K/uL   Basophils Relative 1 %   Basophils Absolute 0.0 0 - 0.1 K/uL  Basic metabolic panel     Status: Abnormal   Collection Time: 03/26/18  1:04 PM  Result Value Ref Range   Sodium 138 135 - 145 mmol/L   Potassium 3.9 3.5 - 5.1 mmol/L   Chloride 106 98 - 111 mmol/L   CO2 27 22 - 32 mmol/L   Glucose, Bld 97 70 - 99 mg/dL   BUN 19 6 - 20 mg/dL   Creatinine, Ser 0.91 0.61 - 1.24 mg/dL   Calcium 8.8 (L) 8.9 - 10.3 mg/dL   GFR calc non Af Amer >60 >60 mL/min   GFR calc Af Amer >60 >60 mL/min   Anion gap 5 5 - 15   ____________________________________________ ____________________________________________  RADIOLOGY  I personally reviewed all radiographic images ordered to evaluate for the above acute complaints and reviewed radiology reports and findings.  These findings were personally discussed with the patient.  Please see medical record for radiology report.  ____________________________________________   PROCEDURES  Procedure(s) performed:  Procedures    Critical Care performed: no ____________________________________________   INITIAL IMPRESSION / ASSESSMENT AND PLAN / ED  COURSE  Pertinent labs & imaging results that were available during my care of the patient were reviewed by me and considered in my medical decision making (see chart for details).   DDX: dvt, contusion, msk strain, celluliits  LEBARON BAUTCH is a 62 y.o. who presents to the ED with symptoms as described above.  Patient with evidence of isolated right calf DVT.  Blood work sent for the above differential was reassuring.  Patient without any other symptoms.  Will start on Eliquis after we discussed extensively the risks and benefits of anticoagulation.  Patient agrees to using Eliquis after discussing other alternatives discussed need for follow-up with PCP as well as have given referral to [facility] given history of prostate cancer.  At this point do not feel  that additional diagnostic testing emergently indicated.  Stable and appropriate for outpatient follow-up.      As part of my medical decision making, I reviewed the following data within the [place] notes reviewed and incorporated, Labs reviewed, notes from prior ED visits.   ____________________________________________   FINAL CLINICAL IMPRESSION(S) / ED DIAGNOSES  Final diagnoses:  Right leg pain  Acute deep vein thrombosis (DVT) of distal vein of right lower extremity (HCC)      NEW MEDICATIONS STARTED DURING THIS VISIT:  New Prescriptions   ELIQUIS STARTER PACK (ELIQUIS STARTER PACK) 5 MG TABS    Take as directed on package: start with two-5mg  tablets twice daily for 7 days. On day 8, switch to one-5mg  tablet twice daily.     Note:  This document was prepared using Dragon voice recognition software and may include unintentional dictation errors.    Merlyn Lot, MD 03/26/18 1344

## 2018-04-06 ENCOUNTER — Other Ambulatory Visit: Payer: Self-pay

## 2018-04-06 ENCOUNTER — Emergency Department
Admission: EM | Admit: 2018-04-06 | Discharge: 2018-04-06 | Disposition: A | Discharge status: Home / Self Care | Payer: 59 | Attending: Emergency Medicine | Admitting: Emergency Medicine

## 2018-04-06 DIAGNOSIS — T7840XA Allergy, unspecified, initial encounter: Secondary | ICD-10-CM | POA: Insufficient documentation

## 2018-04-06 DIAGNOSIS — R220 Localized swelling, mass and lump, head: Secondary | ICD-10-CM | POA: Diagnosis not present

## 2018-04-06 DIAGNOSIS — Z87891 Personal history of nicotine dependence: Secondary | ICD-10-CM | POA: Diagnosis not present

## 2018-04-06 DIAGNOSIS — L509 Urticaria, unspecified: Secondary | ICD-10-CM | POA: Insufficient documentation

## 2018-04-06 DIAGNOSIS — Z8546 Personal history of malignant neoplasm of prostate: Secondary | ICD-10-CM | POA: Diagnosis not present

## 2018-04-06 DIAGNOSIS — L299 Pruritus, unspecified: Secondary | ICD-10-CM | POA: Insufficient documentation

## 2018-04-06 MED ORDER — FAMOTIDINE IN NACL 20-0.9 MG/50ML-% IV SOLN
20.0000 mg | Freq: Once | INTRAVENOUS | Status: AC
  Administered 2018-04-06: 20 mg via INTRAVENOUS
  Filled 2018-04-06: qty 50

## 2018-04-06 MED ORDER — RIVAROXABAN (XARELTO) VTE STARTER PACK (15 & 20 MG): ORAL_TABLET | ORAL | 0 refills | Status: DC

## 2018-04-06 MED ORDER — METHYLPREDNISOLONE SODIUM SUCC 125 MG IJ SOLR
125.0000 mg | Freq: Once | INTRAMUSCULAR | Status: AC
  Administered 2018-04-06: 125 mg via INTRAVENOUS
  Filled 2018-04-06: qty 2

## 2018-04-06 MED ORDER — PREDNISONE 50 MG PO TABS: ORAL_TABLET | ORAL | 0 refills | Status: DC

## 2018-04-06 MED ORDER — DIPHENHYDRAMINE HCL 50 MG/ML IJ SOLN
50.0000 mg | Freq: Once | INTRAMUSCULAR | Status: AC
  Administered 2018-04-06: 50 mg via INTRAVENOUS
  Filled 2018-04-06: qty 1

## 2018-04-06 NOTE — ED Notes (Signed)
Pt unable to sign due to signature pad malfnx. PT in NAD, VSS, ambualtory, verbalizes d.c understanding

## 2018-04-06 NOTE — ED Triage Notes (Addendum)
First nurse note: pt brought over from [facility], with itching and some swelling to his lips, pt reports itching for the past few days, swelling to lips started today, pt started eliquis on sept 30th for dvt, KC concern pt having a reaction to eliquis, no distress noted at this time. Pt denies any issues with his throat or tongue, denies diff breathing, denies taking any bp medications, reports hx of allergies to sudafed and ibuprofen

## 2018-04-06 NOTE — ED Provider Notes (Signed)
[facility] Emergency Department Provider Note       Time seen: ----------------------------------------- 11:08 AM on 04/06/2018 -----------------------------------------   I have reviewed the triage vital signs and the nursing notes.  HISTORY   Chief Complaint Allergic Reaction   HPI Brandon Diaz is a 62 y.o. male with a history of hyperlipidemia, prostate cancer, leukopenia who presents to the ED for right-sided lip swelling.  Patient had some itching and hives at one point.  He states he put alcohol on his back to reduce the redness.  Patient has been started on Eliquis in September 30 for DVT which the patient thinks is from the socks he was wearing being too tight.  He denies any difficulty breathing or swallowing.  Currently he feels improved.  Past Medical History:  Diagnosis Date  . Abnormal prostate specific antigen 12/01/2014  . CA of prostate ([place]) 12/01/2014   followed by urologist   . Decreased libido   . Erectile dysfunction 12/01/2014  . High cholesterol   . Prostate cancer [facility]) 2015   Dr. Erlene Quan    Patient Active Problem List   Diagnosis Date Noted  . Leukopenia 05/26/2017  . 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 Productive cough 03/29/2016  . History of umbilical hernia repair 85/46/2703  . Abnormal prostate specific antigen 12/01/2014  . CA of prostate ([place]) 12/01/2014  . Screening for human immunodeficiency virus 12/01/2014  . Erectile dysfunction 12/01/2014  . HLD (hyperlipidemia) 03/26/2010  . Decreased libido 07/16/2008    Past Surgical History:  Procedure Laterality Date  . COLONOSCOPY  2014   [facility]  . HERNIA REPAIR  5009   umilical     Allergies Ibuprofen and Sudafed [pseudoephedrine hcl]  Social History Social History   Tobacco Use  . Smoking status: Former Smoker    Last attempt to quit: 07/02/1985    Years since quitting: 32.7  . Smokeless tobacco: Never Used  Substance Use Topics  . Alcohol  use: No    Alcohol/week: 0.0 standard drinks  . Drug use: No   Review of Systems Constitutional: Negative for fever. Eyes: Negative for vision changes ENT: Positive for right-sided lip swelling Cardiovascular: Negative for chest pain. Respiratory: Negative for shortness of breath. Gastrointestinal: Negative for abdominal pain, vomiting and diarrhea. Musculoskeletal: Negative for back pain. Skin: Negative for rash. Neurological: Negative for headaches, focal weakness or numbness.  All systems negative/normal/unremarkable except as stated in the HPI  ____________________________________________   PHYSICAL EXAM:  VITAL SIGNS: ED Triage Vitals [04/06/18 0844]  Enc Vitals Group     BP (!) 147/69     Pulse Rate 68     Resp 18     Temp 98.1 F (36.7 C)     Temp Source Oral     SpO2 100 %     Weight 189 lb 13.1 oz (86.1 kg)     Height 5\' 7"  (1.702 m)     Head Circumference      Peak Flow      Pain Score 0     Pain Loc      Pain Edu?      Excl. in [place]?    Constitutional: Alert and oriented. Well appearing and in no distress. Eyes: Conjunctivae are normal. Normal extraocular movements. ENT   Head: Normocephalic and atraumatic.   Nose: No congestion/rhinnorhea.   Mouth/Throat: Slight swelling noted to the right side of his upper lip.  No intraoral swelling, no tongue swelling   Neck: No  stridor. Cardiovascular: Normal rate, regular rhythm. No murmurs, rubs, or gallops. Respiratory: Normal respiratory effort without tachypnea nor retractions. Breath sounds are clear and equal bilaterally. No wheezes/rales/rhonchi. Gastrointestinal: Soft and nontender. Normal bowel sounds Musculoskeletal: Nontender with normal range of motion in extremities. No lower extremity tenderness nor edema. Neurologic:  Normal speech and language. No gross focal neurologic deficits are appreciated.  Skin:  Skin is warm, dry and intact. No rash noted. Psychiatric: Mood and affect are normal.  Speech and behavior are normal.  ____________________________________________  ED COURSE:  As part of my medical decision making, I reviewed the following data within the [facility] History obtained from family if available, nursing notes, old chart and ekg, as well as notes from prior ED visits. Patient presented for likely allergic reaction or angioedema to Eliquis, patient will receive Benadryl, antihistamines and steroids.   Procedures  ____________________________________________  DIFFERENTIAL DIAGNOSIS   Allergic reaction, angioedema, viral infection, cellulitis  FINAL ASSESSMENT AND PLAN  Allergic reaction  Plan: The patient had presented for likely allergic reaction to Eliquis.  Patient had improvement in his symptoms after Benadryl, Pepcid and Solu-Medrol.  I will change him to Xarelto.  I discussed this with pharmacy who agrees with this plan.  He will also be on steroids for several days.   Laurence Aly, MD   Note: This note was generated in part or whole with voice recognition software. Voice recognition is usually quite accurate but there are transcription errors that can and very often do occur. I apologize for any typographical errors that were not detected and corrected.     Earleen Newport, MD 04/06/18 1118

## 2018-04-18 DIAGNOSIS — I824Z1 Acute embolism and thrombosis of unspecified deep veins of right distal lower extremity: Secondary | ICD-10-CM | POA: Diagnosis not present

## 2018-04-18 DIAGNOSIS — E785 Hyperlipidemia, unspecified: Secondary | ICD-10-CM | POA: Diagnosis not present

## 2018-04-18 DIAGNOSIS — R030 Elevated blood-pressure reading, without diagnosis of hypertension: Secondary | ICD-10-CM | POA: Diagnosis not present

## 2018-04-25 ENCOUNTER — Emergency Department
Admission: EM | Admit: 2018-04-25 | Discharge: 2018-04-25 | Disposition: A | Discharge status: Home / Self Care | Payer: 59 | Attending: Student in an Organized Health Care Education/Training Program | Admitting: Student in an Organized Health Care Education/Training Program

## 2018-04-25 ENCOUNTER — Other Ambulatory Visit: Payer: Self-pay

## 2018-04-25 DIAGNOSIS — Z7982 Long term (current) use of aspirin: Secondary | ICD-10-CM | POA: Insufficient documentation

## 2018-04-25 DIAGNOSIS — R600 Localized edema: Secondary | ICD-10-CM | POA: Insufficient documentation

## 2018-04-25 DIAGNOSIS — Z7901 Long term (current) use of anticoagulants: Secondary | ICD-10-CM | POA: Diagnosis not present

## 2018-04-25 DIAGNOSIS — Z8546 Personal history of malignant neoplasm of prostate: Secondary | ICD-10-CM | POA: Diagnosis not present

## 2018-04-25 DIAGNOSIS — T783XXA Angioneurotic edema, initial encounter: Secondary | ICD-10-CM | POA: Diagnosis not present

## 2018-04-25 DIAGNOSIS — Z79899 Other long term (current) drug therapy: Secondary | ICD-10-CM | POA: Diagnosis not present

## 2018-04-25 DIAGNOSIS — R0602 Shortness of breath: Secondary | ICD-10-CM | POA: Diagnosis not present

## 2018-04-25 DIAGNOSIS — Z87891 Personal history of nicotine dependence: Secondary | ICD-10-CM | POA: Insufficient documentation

## 2018-04-25 LAB — CBC WITH DIFFERENTIAL/PLATELET
ABS IMMATURE GRANULOCYTES: 0 10*3/uL (ref 0.00–0.07)
BASOS PCT: 0 %
Basophils Absolute: 0 10*3/uL (ref 0.0–0.1)
Eosinophils Absolute: 0.1 10*3/uL (ref 0.0–0.5)
Eosinophils Relative: 1 %
HEMATOCRIT: 47.9 % (ref 39.0–52.0)
HEMOGLOBIN: 15.6 g/dL (ref 13.0–17.0)
Immature Granulocytes: 0 %
LYMPHS ABS: 1.1 10*3/uL (ref 0.7–4.0)
Lymphocytes Relative: 31 %
MCH: 29.2 pg (ref 26.0–34.0)
MCHC: 32.6 g/dL (ref 30.0–36.0)
MCV: 89.5 fL (ref 80.0–100.0)
MONO ABS: 0.3 10*3/uL (ref 0.1–1.0)
MONOS PCT: 8 %
Neutro Abs: 2.2 10*3/uL (ref 1.7–7.7)
Neutrophils Relative %: 60 %
PLATELETS: 180 10*3/uL (ref 150–400)
RBC: 5.35 MIL/uL (ref 4.22–5.81)
RDW: 12.6 % (ref 11.5–15.5)
WBC: 3.7 10*3/uL — ABNORMAL LOW (ref 4.0–10.5)
nRBC: 0 % (ref 0.0–0.2)

## 2018-04-25 LAB — BASIC METABOLIC PANEL
Anion gap: 8 (ref 5–15)
BUN: 18 mg/dL (ref 6–20)
CHLORIDE: 102 mmol/L (ref 98–111)
CO2: 28 mmol/L (ref 22–32)
Calcium: 9 mg/dL (ref 8.9–10.3)
Creatinine, Ser: 0.93 mg/dL (ref 0.61–1.24)
GFR calc Af Amer: >60 mL/min (ref >60)
GFR calc non Af Amer: >60 mL/min (ref >60)
GLUCOSE: 119 mg/dL — AB (ref 70–99)
POTASSIUM: 3.7 mmol/L (ref 3.5–5.1)
Sodium: 138 mmol/L (ref 135–145)

## 2018-04-25 MED ORDER — FAMOTIDINE IN NACL 20-0.9 MG/50ML-% IV SOLN
20.0000 mg | Freq: Once | INTRAVENOUS | Status: AC
  Administered 2018-04-25: 20 mg via INTRAVENOUS
  Filled 2018-04-25: qty 50

## 2018-04-25 MED ORDER — WARFARIN SODIUM 10 MG PO TABS
10.0000 mg | ORAL_TABLET | Freq: Once | ORAL | Status: AC
Start: 2018-04-25 — End: 2018-04-25
  Administered 2018-04-25: 10 mg via ORAL
  Filled 2018-04-25: qty 1

## 2018-04-25 MED ORDER — ENOXAPARIN SODIUM 80 MG/0.8ML ~~LOC~~ SOLN: 1.0000 mg/kg | Freq: Two times a day (BID) | SUBCUTANEOUS | 0 refills | Status: DC

## 2018-04-25 MED ORDER — METHYLPREDNISOLONE SODIUM SUCC 125 MG IJ SOLR
125.0000 mg | Freq: Once | INTRAMUSCULAR | Status: AC
  Administered 2018-04-25: 125 mg via INTRAVENOUS
  Filled 2018-04-25: qty 2

## 2018-04-25 MED ORDER — WARFARIN SODIUM 5 MG PO TABS: 5.0000 mg | ORAL_TABLET | Freq: Every day | ORAL | 0 refills | Status: DC

## 2018-04-25 MED ORDER — PREDNISONE 20 MG PO TABS: 40.0000 mg | ORAL_TABLET | Freq: Every day | ORAL | 0 refills | Status: AC

## 2018-04-25 MED ORDER — ENOXAPARIN SODIUM 80 MG/0.8ML ~~LOC~~ SOLN
80.0000 mg | Freq: Two times a day (BID) | SUBCUTANEOUS | Status: DC
  Administered 2018-04-25: 80 mg via SUBCUTANEOUS
  Filled 2018-04-25: qty 0.8

## 2018-04-25 NOTE — ED Provider Notes (Signed)
[facility] Emergency Department Provider Note    First MD Initiated Contact with Patient 04/25/18 [phone]     (approximate)  I have reviewed the triage vital signs and the nursing notes.   HISTORY  Chief Complaint Allergic Reaction    HPI CHIRON CAMPIONE is a 62 y.o. male presents the ER for evaluation of right lower lip swelling and some shortness of breath that started this morning.  Patient was recently diagnosed the beginning this month with lower extremity DVT was started on Eliquis.  Several days later developed swelling of his upper lip and due to concern for allergic reaction and angioedema related to the Eliquis was transitioned to Xarelto.  This occurred on the 11th of the month.  Since then he has been compliant with his Xarelto but this morning noted swelling on the right lower lip some tingling sensation related to this and some shortness of breath.  Denies any trouble swallowing.  No change in phonation.  Denies any other associated symptoms.  Denies any other new medications.  He did take 50 mg of Benadryl prior to arrival.  He does not feel like it is getting worse    Past Medical History:  Diagnosis Date  . Abnormal prostate specific antigen 12/01/2014  . CA of prostate ([place]) 12/01/2014   followed by urologist   . Decreased libido   . Erectile dysfunction 12/01/2014  . High cholesterol   . Prostate cancer [facility]) 2015   Dr. Erlene Quan   Family History  Problem Relation Age of Onset  . Stroke Mother   . Pancreatic cancer Brother   . Kidney failure Father   . Prostate cancer Brother    Past Surgical History:  Procedure Laterality Date  . COLONOSCOPY  2014   [facility]  . HERNIA REPAIR  3235   umilical    Patient Active Problem List   Diagnosis Date Noted  . Leukopenia 05/26/2017  . 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 Productive cough 03/29/2016  . History of umbilical hernia repair 57/32/2025  . Abnormal prostate specific  antigen 12/01/2014  . CA of prostate ([place]) 12/01/2014  . Screening for human immunodeficiency virus 12/01/2014  . Erectile dysfunction 12/01/2014  . HLD (hyperlipidemia) 03/26/2010  . Decreased libido 07/16/2008      Prior to Admission medications   Medication Sig Start Date End Date Taking? Authorizing Provider  aspirin 325 MG EC tablet Take 325 mg by mouth daily.    [provider]  ELIQUIS STARTER PACK (ELIQUIS STARTER PACK) 5 MG TABS Take as directed on package: start with two-5mg  tablets twice daily for 7 days. On day 8, switch to one-5mg  tablet twice daily. 03/26/18   Merlyn Lot, MD  enoxaparin (LOVENOX) 80 MG/0.8ML injection Inject 0.85 mLs (85 mg total) into the skin 2 (two) times daily for 5 days. 04/25/18 04/30/18  Merlyn Lot, MD  predniSONE (DELTASONE) 20 MG tablet Take 2 tablets (40 mg total) by mouth daily for 5 days. 04/25/18 04/30/18  Merlyn Lot, MD  predniSONE (DELTASONE) 50 MG tablet Take 1 tablet by mouth daily 04/06/18   Earleen Newport, MD  Rivaroxaban 15 & 20 MG TBPK Take as directed on package: Start with one 15mg  tablet by mouth twice a day with food. On Day 22, switch to one 20mg  tablet once a day with food. 04/06/18   Earleen Newport, MD  rosuvastatin (CRESTOR) 10 MG tablet Take 1 tablet (10 mg total) daily by mouth. 05/11/17  Roselee Nova, MD  warfarin (COUMADIN) 5 MG tablet Take 1 tablet (5 mg total) by mouth daily. 04/25/18 05/25/18  Merlyn Lot, MD    Allergies Ibuprofen and Sudafed [pseudoephedrine hcl]    Social History Social History   Tobacco Use  . Smoking status: Former Smoker    Last attempt to quit: 07/02/1985    Years since quitting: 32.8  . Smokeless tobacco: Never Used  Substance Use Topics  . Alcohol use: No    Alcohol/week: 0.0 standard drinks  . Drug use: No    Review of Systems Patient denies headaches, rhinorrhea, blurry vision, numbness, shortness of breath, chest pain, edema, cough,  abdominal pain, nausea, vomiting, diarrhea, dysuria, fevers, rashes or hallucinations unless otherwise stated above in HPI. ____________________________________________   PHYSICAL EXAM:  VITAL SIGNS: Vitals:   04/25/18 0730 04/25/18 0900  BP: 124/69 114/72  Pulse: 88 72  Resp: 15 16  Temp:    SpO2: 97% 100%    Constitutional: Alert and oriented.  Eyes: Conjunctivae are normal.  Head: Atraumatic. Nose: No congestion/rhinnorhea. Mouth/Throat: Mucous membranes are moist. Small amount of angioedema to right lateral loer lip,  Parotid glands are soft and non tender, no sublingual ttp or firmness, no trismus, no uvular swelling  Neck: No stridor. Painless ROM.  Cardiovascular: Normal rate, regular rhythm. Grossly normal heart sounds.  Good peripheral circulation. Respiratory: Normal respiratory effort.  No retractions. Lungs CTAB. Gastrointestinal: Soft and nontender. No distention. No abdominal bruits. No CVA tenderness. Genitourinary: deferred Musculoskeletal: No lower extremity tenderness nor edema.  No joint effusions. Neurologic:  Normal speech and language. No gross focal neurologic deficits are appreciated. No facial droop Skin:  Skin is warm, dry and intact. No rash noted. Psychiatric: Mood and affect are normal. Speech and behavior are normal.  ____________________________________________   LABS (all labs ordered are listed, but only abnormal results are displayed)  Results for orders placed or performed during the hospital encounter of 04/25/18 (from the past 24 hour(s))  CBC with Differential/Platelet     Status: Abnormal   Collection Time: 04/25/18  7:36 AM  Result Value Ref Range   WBC 3.7 (L) 4.0 - 10.5 K/uL   RBC 5.35 4.22 - 5.81 MIL/uL   Hemoglobin 15.6 13.0 - 17.0 g/dL   HCT 47.9 39.0 - 52.0 %   MCV 89.5 80.0 - 100.0 fL   MCH 29.2 26.0 - 34.0 pg   MCHC 32.6 30.0 - 36.0 g/dL   RDW 12.6 11.5 - 15.5 %   Platelets 180 150 - 400 K/uL   nRBC 0.0 0.0 - 0.2 %    Neutrophils Relative % 60 %   Neutro Abs 2.2 1.7 - 7.7 K/uL   Lymphocytes Relative 31 %   Lymphs Abs 1.1 0.7 - 4.0 K/uL   Monocytes Relative 8 %   Monocytes Absolute 0.3 0.1 - 1.0 K/uL   Eosinophils Relative 1 %   Eosinophils Absolute 0.1 0.0 - 0.5 K/uL   Basophils Relative 0 %   Basophils Absolute 0.0 0.0 - 0.1 K/uL   Immature Granulocytes 0 %   Abs Immature Granulocytes 0.00 0.00 - 0.07 K/uL  Basic metabolic panel     Status: Abnormal   Collection Time: 04/25/18  7:36 AM  Result Value Ref Range   Sodium 138 135 - 145 mmol/L   Potassium 3.7 3.5 - 5.1 mmol/L   Chloride 102 98 - 111 mmol/L   CO2 28 22 - 32 mmol/L   Glucose, Bld 119 (H)  70 - 99 mg/dL   BUN 18 6 - 20 mg/dL   Creatinine, Ser 0.93 0.61 - 1.24 mg/dL   Calcium 9.0 8.9 - 10.3 mg/dL   GFR calc non Af Amer >60 >60 mL/min   GFR calc Af Amer >60 >60 mL/min   Anion gap 8 5 - 15   ____________________________________________ ____________________________________________  RADIOLOGY   ____________________________________________   PROCEDURES  Procedure(s) performed:  Procedures    Critical Care performed: no ____________________________________________   INITIAL IMPRESSION / ASSESSMENT AND PLAN / ED COURSE  Pertinent labs & imaging results that were available during my care of the patient were reviewed by me and considered in my medical decision making (see chart for details).   DDX: anaphylaxis, anaphylactoid, urticaria, drug reaction, angioedema  JAIEL SARACENO is a 62 y.o. who presents to the ED with symptoms as described above.  Patient does have very mild angioedema of the lip.  No respiratory distress.  Will give Pepcid and steroids and observe.  Likely having some intolerance to Xarelto.  Will check blood work observe and reassess.  Clinical Course as of Apr 25 942  Wed Apr 25, 2018  0806 I consulted pharmacy regarding the patient's presentation.  At this point patient will not be able to tolerate  any NOAC due to angioedema related to these medications.  Assuming normal renal function blood work will start on Coumadin.   [PR]  [phone] Reassessment the patient's angioedema is resolving.   [PR]  [phone] I spoke with Dr. Edwina Barth, the patient's primary physician, regarding current plan of therapy with Coumadin and agrees to get patient into clinic this Friday for INR and reevaluation.   [PR]    Clinical Course User Index [PR] Merlyn Lot, MD     As part of my medical decision making, I reviewed the following data within the [place] notes reviewed and incorporated, Labs reviewed, notes from prior ED visits and [place] Controlled Substance Database   ____________________________________________   FINAL CLINICAL IMPRESSION(S) / ED DIAGNOSES  Final diagnoses:  Angioedema, initial encounter      NEW MEDICATIONS STARTED DURING THIS VISIT:  New Prescriptions   ENOXAPARIN (LOVENOX) 80 MG/0.8ML INJECTION    Inject 0.85 mLs (85 mg total) into the skin 2 (two) times daily for 5 days.   PREDNISONE (DELTASONE) 20 MG TABLET    Take 2 tablets (40 mg total) by mouth daily for 5 days.   WARFARIN (COUMADIN) 5 MG TABLET    Take 1 tablet (5 mg total) by mouth daily.     Note:  This document was prepared using Dragon voice recognition software and may include unintentional dictation errors.    Merlyn Lot, MD 04/25/18 [phone]

## 2018-04-25 NOTE — Discharge Instructions (Signed)
Please inject 80 mg of Lovenox twice daily as discussed in the ER.  He will also start taking 5 mg of Coumadin daily.  Follow-up with Dr. Tillman Sers office on Friday for Coumadin level check and follow-up.  Return if you develop any worsening swelling, shortness of breath or for any questions or concerns.

## 2018-04-25 NOTE — ED Notes (Signed)
First Nurse Note: Patient complaining of [facility], speaking in full sentences.

## 2018-04-25 NOTE — ED Triage Notes (Addendum)
Pt c/o waking with facial and lip swelling with some SOB this morning. States he was recently changes to xeralto from eliquis due to an allergy to the medication. States he took 2 benadryl around 615am this morning

## 2018-04-27 DIAGNOSIS — R791 Abnormal coagulation profile: Secondary | ICD-10-CM | POA: Diagnosis not present

## 2018-04-30 DIAGNOSIS — I824Z1 Acute embolism and thrombosis of unspecified deep veins of right distal lower extremity: Secondary | ICD-10-CM | POA: Diagnosis not present

## 2018-04-30 DIAGNOSIS — T783XXA Angioneurotic edema, initial encounter: Secondary | ICD-10-CM | POA: Diagnosis not present

## 2018-05-02 DIAGNOSIS — R791 Abnormal coagulation profile: Secondary | ICD-10-CM | POA: Diagnosis not present

## 2018-05-10 DIAGNOSIS — R791 Abnormal coagulation profile: Secondary | ICD-10-CM | POA: Diagnosis not present

## 2018-05-23 DIAGNOSIS — R791 Abnormal coagulation profile: Secondary | ICD-10-CM | POA: Diagnosis not present

## 2018-06-04 DIAGNOSIS — R791 Abnormal coagulation profile: Secondary | ICD-10-CM | POA: Diagnosis not present

## 2018-07-05 DIAGNOSIS — R791 Abnormal coagulation profile: Secondary | ICD-10-CM | POA: Diagnosis not present

## 2018-07-06 ENCOUNTER — Ambulatory Visit: Payer: Self-pay | Admitting: Urology

## 2018-07-06 DIAGNOSIS — I824Z1 Acute embolism and thrombosis of unspecified deep veins of right distal lower extremity: Secondary | ICD-10-CM | POA: Diagnosis not present

## 2018-07-06 DIAGNOSIS — R079 Chest pain, unspecified: Secondary | ICD-10-CM | POA: Diagnosis not present

## 2018-07-16 ENCOUNTER — Other Ambulatory Visit: Payer: 59

## 2018-07-16 DIAGNOSIS — E785 Hyperlipidemia, unspecified: Secondary | ICD-10-CM | POA: Diagnosis not present

## 2018-07-16 DIAGNOSIS — I208 Other forms of angina pectoris: Secondary | ICD-10-CM | POA: Diagnosis not present

## 2018-07-16 DIAGNOSIS — C61 Malignant neoplasm of prostate: Secondary | ICD-10-CM | POA: Diagnosis not present

## 2018-07-16 DIAGNOSIS — R0602 Shortness of breath: Secondary | ICD-10-CM | POA: Diagnosis not present

## 2018-07-17 LAB — PSA: PROSTATE SPECIFIC AG, SERUM: 5 ng/mL — AB (ref 0.0–4.0)

## 2018-07-19 ENCOUNTER — Other Ambulatory Visit: Payer: Self-pay

## 2018-07-24 ENCOUNTER — Ambulatory Visit (INDEPENDENT_AMBULATORY_CARE_PROVIDER_SITE_OTHER): Payer: 59 | Admitting: Urology

## 2018-07-24 ENCOUNTER — Encounter: Payer: Self-pay | Admitting: Urology

## 2018-07-24 VITALS — BP 166/81 | HR 97 | Ht 68.0 in | Wt 196.0 lb

## 2018-07-24 DIAGNOSIS — C61 Malignant neoplasm of prostate: Secondary | ICD-10-CM | POA: Diagnosis not present

## 2018-07-24 DIAGNOSIS — R972 Elevated prostate specific antigen [PSA]: Secondary | ICD-10-CM | POA: Diagnosis not present

## 2018-07-24 NOTE — Progress Notes (Signed)
3:38 PM  07/21/17  Brandon Diaz Jul 10, 1955 [id number]  Referring provider: Roselee Nova, MD [address] [place], [place] [address]  Chief Complaint  Patient presents with  . Prostate Cancer    HPI: Brandon Diaz is a 63 y.o. male with a history of prostate cancer that presents for a routine 1-year follow up with PSA/ DRE.  He is overall doing well this year.    Prostate Cancer s/p prostate biopsy on 04/28/14. His initial pathology results showed Gleason 3+3 prostate cancer in 3/12 cores, 2 on left (lateral base/ lateral mid) and right apex involving 6-27% of each core. DRE unremarkable (cT1c), TRUS vol 37 cc.   He underwent routine confirmatory biopsy on 08/2014 which showed Gleason 3+3 in 2/12 cores bilateral involving only 1% of each core.  Strong family history of prostate cancer, brother dx and treated around his current same age who underwent radical prostatectomy.  His PSA is slowly rising. His last PSA (07/16/2018) was 5.0 which was a slight increase from his previous 4.46 on 01/17/2017.  Following a blood clot in his leg he was placed on Lovenox and Xarelto in September. He started experiencing frequency and nocturia which resolved when he stopped Lovenox.  He denies any urinary symptoms at this time.  PMH: Past Medical History:  Diagnosis Date  . Abnormal prostate specific antigen 12/01/2014  . CA of prostate ([place]) 12/01/2014   followed by urologist   . Decreased libido   . Erectile dysfunction 12/01/2014  . High cholesterol   . Prostate cancer [facility]) 2015   Dr. Erlene Quan    Surgical History: Past Surgical History:  Procedure Laterality Date  . COLONOSCOPY  2014   [facility]  . HERNIA REPAIR  0973   umilical     Home Medications:  Allergies as of 07/24/2018      Reactions   Apixaban Hives, Itching, Shortness Of Breath, Swelling   Ibuprofen Shortness Of Breath   Sudafed [pseudoephedrine Hcl] Shortness Of Breath   Shortness of breath   Xarelto [rivaroxaban] Hives, Itching, Shortness Of Breath, Swelling      Medication List       Accurate as of July 24, 2018  3:38 PM. Always use your most recent med list.        aspirin 325 MG EC tablet Take 325 mg by mouth daily.   BENADRYL ALLERGY PO Take by mouth.   ELIQUIS STARTER PACK 5 MG Tabs Take as directed on package: start with two-5mg  tablets twice daily for 7 days. On day 8, switch to one-5mg  tablet twice daily.   predniSONE 50 MG tablet Commonly known as:  DELTASONE Take 1 tablet by mouth daily   Rivaroxaban 15 & 20 MG Tbpk Take as directed on package: Start with one 15mg  tablet by mouth twice a day with food. On Day 22, switch to one 20mg  tablet once a day with food.   rosuvastatin 10 MG tablet Commonly known as:  CRESTOR Take 1 tablet (10 mg total) daily by mouth.   warfarin 5 MG tablet Commonly known as:  COUMADIN Take 1 tablet (5 mg total) by mouth daily.       Allergies:  Allergies  Allergen Reactions  . Apixaban Hives, Itching, Shortness Of Breath and Swelling  . Ibuprofen Shortness Of Breath  . Sudafed [Pseudoephedrine Hcl] Shortness Of Breath    Shortness of breath  . Xarelto [Rivaroxaban] Hives, Itching, Shortness Of Breath and Swelling    Family History:  Family History  Problem Relation Age of Onset  . Stroke Mother   . Pancreatic cancer Brother   . Kidney failure Father   . Prostate cancer Brother     Social History:  reports that he quit smoking about 33 years ago. He has never used smokeless tobacco. He reports that he does not drink alcohol or use drugs.  ROS: UROLOGY Frequent Urination?: No Hard to postpone urination?: No Burning/pain with urination?: No Get up at night to urinate?: No Leakage of urine?: No Urine stream starts and stops?: No Trouble starting stream?: No Do you have to strain to urinate?: No Blood in urine?: No Urinary tract infection?: No Sexually transmitted disease?: No Injury to kidneys or  bladder?: No Painful intercourse?: No Weak stream?: No Erection problems?: No Penile pain?: No  Gastrointestinal Nausea?: No Vomiting?: No Indigestion/heartburn?: No Diarrhea?: No Constipation?: No  Constitutional Fever: No Night sweats?: No Weight loss?: No Fatigue?: No  Skin Skin rash/lesions?: No Itching?: No  Eyes Blurred vision?: No Double vision?: No  Ears/Nose/Throat Sore throat?: No Sinus problems?: No  Hematologic/Lymphatic Swollen glands?: No Easy bruising?: No  Cardiovascular Leg swelling?: No Chest pain?: No  Respiratory Cough?: No Shortness of breath?: No  Endocrine Excessive thirst?: No  Musculoskeletal Back pain?: No Joint pain?: No  Neurological Headaches?: No Dizziness?: No  Psychologic Depression?: No Anxiety?: No  Physical Exam: BP (!) 166/81 (BP Location: Left Arm, Patient Position: Sitting)   Pulse 97   Ht 5\' 8"  (1.727 m)   Wt 196 lb (88.9 kg)   BMI 29.80 kg/m   Constitutional:  Alert and oriented, No acute distress. Well dressed. Respiratory: Normal respiratory effort, no increased work of breathing. GU: No CVA tenderness.  Rectal exam: Normal sphincter tone. Prostate is enlarged, approximately 50+ gram, no nodules, non-tender. Skin: No rashes, bruises or suspicious lesions. Neurologic: Grossly intact, no focal deficits, moving all 4 extremities. Psychiatric: Normal mood and affect.  Laboratory Data: Lab Results  Component Value Date   WBC 3.7 (L) 04/25/2018   HGB 15.6 04/25/2018   HCT 47.9 04/25/2018   MCV 89.5 04/25/2018   PLT 180 04/25/2018    Lab Results  Component Value Date   CREATININE 0.93 04/25/2018   PSA Trend: 02/06/2015 3.5 ng/mL 07/17/2015 3.3 ng/mL 01/19/2016 3.7 ng/mL 01/19/2017 4.2 ng/mL 07/21/2017 4.46 ng/mL 01/17/2018 4.6 ng/mL 07/16/2018 5.0 ng/mL  Assessment & Plan:    1. Prostate  Cancer/ rising PSA  - dx 04/2014 cT1c Gleason 3+3 prostate cancer in 3/13 cores, iPSA 3.1 with family  history of prostate cancer, DRE unremarkable, TRUS vol 37 g.   - Repeat biopsy 08/2014, Gleason 3+3 in 2/12 cores, only 1% each core  - Slow rising PSA. Last 5.0 (07/16/2018) from previous 4.6 (01/17/2018)  - I recommend prostate MRI. Patient is agreeable with this plan  - Plan for PSA only in 6 months, and PSA with DRE in 70-months at follow up  - Will call patient with MRI results  [facility] [address], [place], [place] [address] [phone]  I, Temidayo Atanda-Ogunleye , am acting as a scribe for Hollice Espy, MD  I have reviewed the above documentation for accuracy and completeness, and I agree with the above.   Hollice Espy, MD

## 2018-07-26 DIAGNOSIS — E785 Hyperlipidemia, unspecified: Secondary | ICD-10-CM | POA: Diagnosis not present

## 2018-07-26 DIAGNOSIS — I824Z1 Acute embolism and thrombosis of unspecified deep veins of right distal lower extremity: Secondary | ICD-10-CM | POA: Diagnosis not present

## 2018-07-27 DIAGNOSIS — I824Z1 Acute embolism and thrombosis of unspecified deep veins of right distal lower extremity: Secondary | ICD-10-CM | POA: Diagnosis not present

## 2018-07-27 DIAGNOSIS — R030 Elevated blood-pressure reading, without diagnosis of hypertension: Secondary | ICD-10-CM | POA: Diagnosis not present

## 2018-07-27 DIAGNOSIS — C61 Malignant neoplasm of prostate: Secondary | ICD-10-CM | POA: Diagnosis not present

## 2018-07-31 ENCOUNTER — Other Ambulatory Visit: Payer: Self-pay | Admitting: Internal Medicine

## 2018-07-31 DIAGNOSIS — I824Z1 Acute embolism and thrombosis of unspecified deep veins of right distal lower extremity: Secondary | ICD-10-CM

## 2018-07-31 DIAGNOSIS — R0602 Shortness of breath: Secondary | ICD-10-CM | POA: Diagnosis not present

## 2018-07-31 DIAGNOSIS — I208 Other forms of angina pectoris: Secondary | ICD-10-CM | POA: Diagnosis not present

## 2018-08-06 DIAGNOSIS — R791 Abnormal coagulation profile: Secondary | ICD-10-CM | POA: Diagnosis not present

## 2018-08-07 DIAGNOSIS — I480 Paroxysmal atrial fibrillation: Secondary | ICD-10-CM | POA: Diagnosis not present

## 2018-08-07 DIAGNOSIS — I208 Other forms of angina pectoris: Secondary | ICD-10-CM | POA: Diagnosis not present

## 2018-08-07 DIAGNOSIS — R0989 Other specified symptoms and signs involving the circulatory and respiratory systems: Secondary | ICD-10-CM | POA: Diagnosis not present

## 2018-08-08 ENCOUNTER — Ambulatory Visit
Admission: RE | Admit: 2018-08-08 | Discharge: 2018-08-08 | Disposition: A | Discharge status: Home / Self Care | Payer: 59 | Source: Ambulatory Visit | Attending: Internal Medicine | Admitting: Internal Medicine

## 2018-08-08 DIAGNOSIS — I824Z1 Acute embolism and thrombosis of unspecified deep veins of right distal lower extremity: Secondary | ICD-10-CM | POA: Diagnosis not present

## 2018-08-08 DIAGNOSIS — Z86718 Personal history of other venous thrombosis and embolism: Secondary | ICD-10-CM | POA: Diagnosis not present

## 2018-08-10 ENCOUNTER — Other Ambulatory Visit: Payer: Self-pay | Admitting: Urology

## 2018-08-14 ENCOUNTER — Ambulatory Visit
Admission: RE | Admit: 2018-08-14 | Discharge: 2018-08-14 | Disposition: A | Discharge status: Home / Self Care | Payer: 59 | Source: Ambulatory Visit | Attending: Urology | Admitting: Urology

## 2018-08-14 ENCOUNTER — Ambulatory Visit: Payer: 59

## 2018-08-14 DIAGNOSIS — R972 Elevated prostate specific antigen [PSA]: Secondary | ICD-10-CM | POA: Diagnosis not present

## 2018-08-14 DIAGNOSIS — C61 Malignant neoplasm of prostate: Secondary | ICD-10-CM | POA: Diagnosis present

## 2018-08-14 MED ORDER — GADOBUTROL 1 MMOL/ML IV SOLN
9.0000 mL | Freq: Once | INTRAVENOUS | Status: AC | PRN
  Administered 2018-08-14: 9 mL via INTRAVENOUS

## 2018-08-27 DIAGNOSIS — I6523 Occlusion and stenosis of bilateral carotid arteries: Secondary | ICD-10-CM | POA: Diagnosis not present

## 2018-08-27 DIAGNOSIS — R0989 Other specified symptoms and signs involving the circulatory and respiratory systems: Secondary | ICD-10-CM | POA: Diagnosis not present

## 2018-10-09 ENCOUNTER — Other Ambulatory Visit: Payer: Self-pay

## 2018-10-09 ENCOUNTER — Telehealth (INDEPENDENT_AMBULATORY_CARE_PROVIDER_SITE_OTHER): Payer: 59 | Admitting: Urology

## 2018-10-09 DIAGNOSIS — C61 Malignant neoplasm of prostate: Secondary | ICD-10-CM

## 2018-10-09 DIAGNOSIS — R972 Elevated prostate specific antigen [PSA]: Secondary | ICD-10-CM

## 2018-10-09 NOTE — Progress Notes (Signed)
Virtual Visit via Video Note  I connected with Brandon Diaz on 10/09/18 at  2:30 PM EDT by a video enabled telemedicine application and verified that I am speaking with the correct person using two identifiers.   I discussed the limitations of evaluation and management by telemedicine and the availability of in person appointments. The patient expressed understanding and agreed to proceed.  History of Present Illness: 63 year old male with a personal history of low risk low-volume prostate cancer on active surveillance who returns today via virtual visit to review his prostate MRI.  Please see previous notes for prostate cancer history.  He was first diagnosed in 2015 followed by confirmatory biopsy in 2016 revealing similar Gleason 3+3, low-volume disease.    More recently, his PSA is risen to 5.0 which is slightly elevated from previous visits at 4.46 in 2018.  More recently, he underwent a prostate MRI on 08/15/2018 primarily for surveillance reasons showing a small PI-RADS 3 lesion between the posterior lateral and anterior peripheral zone on the right mid gland and apex without enhancement.  No evidence of extracapsular extension, adenopathy or any other suspicious findings.  Prostate volume is approximately 45 cc.  Today, he has no complaints.   Observations/Objective: Pleasant, interactive.  Assessment and Plan:  1. Prostate cancer [facility]) On active surveillance for low risk prostate cancer Extensive review of prostate MRI today, small PI-RADS 3 lesion without any other suspicious findings. We discussed that these findings are essentially equivocal and may correlate with his previous pathology or perhaps even represent alternative nonspecific finding versus high-grade lesion. We discussed alternatives today including continued surveillance and trending PSA, consideration of fusion biopsy versus repeating MRI in 6 to 12 months Given that he is currently on Coumadin for history of  DVT, we will plan on holding off on fusion biopsy at this time with plans for repeat PSA at the 13-month interval (July 2020).  If his PSA continues to rise, may consider pursuing fusion biopsy after getting clearance to hold anticoagulant if deemed necessary Patient is agreeable this plan He multiple questions all of which were answered today  2. Rising PSA level Ass above    Follow Up Instructions: F/u July with PSA prior (would like to be notified of appointment via [place])   I discussed the assessment and treatment plan with the patient. The patient was provided an opportunity to ask questions and all were answered. The patient agreed with the plan and demonstrated an understanding of the instructions.   The patient was advised to call back or seek an in-person evaluation if the symptoms worsen or if the condition fails to improve as anticipated.  I provided 15 minutes of non-face-to-face time during this encounter.   Hollice Espy, MD

## 2019-01-18 ENCOUNTER — Other Ambulatory Visit: Payer: Self-pay

## 2019-01-18 DIAGNOSIS — R972 Elevated prostate specific antigen [PSA]: Secondary | ICD-10-CM

## 2019-01-21 ENCOUNTER — Other Ambulatory Visit: Payer: Self-pay

## 2019-01-21 ENCOUNTER — Other Ambulatory Visit: Payer: 59

## 2019-01-21 DIAGNOSIS — R972 Elevated prostate specific antigen [PSA]: Secondary | ICD-10-CM

## 2019-01-22 LAB — PSA: Prostate Specific Ag, Serum: 6.4 ng/mL — ABNORMAL HIGH (ref 0.0–4.0)

## 2019-01-29 ENCOUNTER — Other Ambulatory Visit: Payer: Self-pay

## 2019-01-29 ENCOUNTER — Encounter: Payer: Self-pay | Admitting: Urology

## 2019-01-29 ENCOUNTER — Ambulatory Visit (INDEPENDENT_AMBULATORY_CARE_PROVIDER_SITE_OTHER): Payer: 59 | Admitting: Urology

## 2019-01-29 VITALS — BP 181/85 | HR 98 | Ht 68.0 in | Wt 198.0 lb

## 2019-01-29 DIAGNOSIS — C61 Malignant neoplasm of prostate: Secondary | ICD-10-CM | POA: Diagnosis not present

## 2019-01-29 DIAGNOSIS — R972 Elevated prostate specific antigen [PSA]: Secondary | ICD-10-CM | POA: Diagnosis not present

## 2019-01-29 NOTE — Progress Notes (Signed)
01/29/2019 1:32 PM   Brandon Diaz 01-Feb-1956 [id number]  Referring provider: Baxter Hire, MD [place],  [place] [address]  Chief Complaint  Patient presents with  . Prostate Cancer    HPI: 63 year old male with a history of low risk low-volume prostate cancer on active surveillance returns today for routine follow-up.  He was last seen in 09/2018 virtually following prostate MRI.  Please see previous notes for prostate cancer history.  He was first diagnosed in 2015 followed by confirmatory biopsy in 2016 revealing similar Gleason 3+3, low-volume disease.   HIs PSA roseri to 5.0 which is slightly elevated from previous visits at 4.46 in 2018.  He underwent a prostate MRI on 08/15/2018 primarily for surveillance reasons showing a small PI-RADS 3 lesion between the posterior lateral and anterior peripheral zone on the right mid gland and apex without enhancement.  No evidence of extracapsular extension, adenopathy or any other suspicious findings.  Prostate volume is approximately 45 cc.  At the time, we elected to continue to follow his PSA even though fairly equivocal MRI findings.  His PSA unfortunately continues to rise, now up to 6.4.  Notably, he is on anticoagulation for a DVT in September.  PMH: Past Medical History:  Diagnosis Date  . Abnormal prostate specific antigen 12/01/2014  . CA of prostate ([place]) 12/01/2014   followed by urologist   . Decreased libido   . Erectile dysfunction 12/01/2014  . High cholesterol   . Prostate cancer [facility]) 2015   Dr. Erlene Quan    Surgical History: Past Surgical History:  Procedure Laterality Date  . COLONOSCOPY  2014   [facility]  . HERNIA REPAIR  0263   umilical     Home Medications:  Allergies as of 01/29/2019      Reactions   Apixaban Hives, Itching, Shortness Of Breath, Swelling   Ibuprofen Shortness Of Breath   Sudafed [pseudoephedrine Hcl] Shortness Of Breath   Shortness of breath   Xarelto  [rivaroxaban] Hives, Itching, Shortness Of Breath, Swelling      Medication List       Accurate as of January 29, 2019  1:32 PM. If you have any questions, ask your nurse or doctor.        STOP taking these medications   Eliquis DVT/PE Starter Pack 5 MG Tabs Stopped by: Hollice Espy, MD   predniSONE 50 MG tablet Commonly known as: DELTASONE Stopped by: Hollice Espy, MD   Rivaroxaban 15 & 20 MG Tbpk Stopped by: Hollice Espy, MD   warfarin 5 MG tablet Commonly known as: COUMADIN Stopped by: Hollice Espy, MD     TAKE these medications   aspirin 325 MG EC tablet Take 325 mg by mouth daily.   BENADRYL ALLERGY PO Take by mouth.   rosuvastatin 10 MG tablet Commonly known as: CRESTOR Take 1 tablet (10 mg total) daily by mouth.       Allergies:  Allergies  Allergen Reactions  . Apixaban Hives, Itching, Shortness Of Breath and Swelling  . Ibuprofen Shortness Of Breath  . Sudafed [Pseudoephedrine Hcl] Shortness Of Breath    Shortness of breath  . Xarelto [Rivaroxaban] Hives, Itching, Shortness Of Breath and Swelling    Family History: Family History  Problem Relation Age of Onset  . Stroke Mother   . Pancreatic cancer Brother   . Kidney failure Father   . Prostate cancer Brother     Social History:  reports that he quit smoking about 33 years ago.  He has never used smokeless tobacco. He reports that he does not drink alcohol or use drugs.  ROS: UROLOGY Frequent Urination?: No Hard to postpone urination?: No Burning/pain with urination?: No Get up at night to urinate?: No Leakage of urine?: No Urine stream starts and stops?: No Trouble starting stream?: No Do you have to strain to urinate?: No Blood in urine?: No Urinary tract infection?: No Sexually transmitted disease?: No Injury to kidneys or bladder?: No Painful intercourse?: No Weak stream?: No Erection problems?: No Penile pain?: No  Gastrointestinal Nausea?: No Vomiting?: No  Indigestion/heartburn?: No Diarrhea?: No Constipation?: No  Constitutional Fever: No Night sweats?: No Weight loss?: No Fatigue?: No  Skin Skin rash/lesions?: No Itching?: No  Eyes Blurred vision?: No Double vision?: No  Ears/Nose/Throat Sore throat?: No Sinus problems?: No  Hematologic/Lymphatic Swollen glands?: No Easy bruising?: No  Cardiovascular Leg swelling?: No Chest pain?: No  Respiratory Cough?: No Shortness of breath?: No  Endocrine Excessive thirst?: No  Musculoskeletal Back pain?: No Joint pain?: No  Neurological Headaches?: No Dizziness?: No  Psychologic Depression?: No Anxiety?: No  Physical Exam: BP (!) 181/85   Pulse 98   Ht 5\' 8"  (1.727 m)   Wt 198 lb (89.8 kg)   BMI 30.11 kg/m   Constitutional:  Alert and oriented, No acute distress. HEENT: [place] AT, moist mucus membranes.  Trachea midline, no masses. Cardiovascular: No clubbing, cyanosis, or edema. Respiratory: Normal respiratory effort, no increased work of breathing. Skin: No rashes, bruises or suspicious lesions. Neurologic: Grossly intact, no focal deficits, moving all 4 extremities. Psychiatric: Normal mood and affect.  Laboratory Data: Lab Results  Component Value Date   WBC 3.7 (L) 04/25/2018   HGB 15.6 04/25/2018   HCT 47.9 04/25/2018   MCV 89.5 04/25/2018   PLT 180 04/25/2018    Lab Results  Component Value Date   CREATININE 0.93 04/25/2018     Assessment & Plan:    1. Prostate cancer [facility]) Currently on active surveillance for low risk prostate cancer Unfortunately, his PSA continues to rise Most recent MRI in 08/2018 with an equivocal PI-RADS 3 lesion In the setting of rising PSA as well as this new lesion, recommended consideration of fusion biopsy Risk and benefits were discussed All questions answered and he would like to proceed as above He is no longer on anticoagulants for history of DVT this should not not be an issue proceeding with biopsy at  this time He will return for prostate biopsy results - Ambulatory referral to Urology  2. Rising PSA level As above   Return for fusion biopsy results .  Hollice Espy, MD  [facility] [address], [place] [employer],  [address] [phone]

## 2019-03-07 ENCOUNTER — Other Ambulatory Visit: Payer: Self-pay | Admitting: Urology

## 2019-03-13 ENCOUNTER — Ambulatory Visit (INDEPENDENT_AMBULATORY_CARE_PROVIDER_SITE_OTHER): Payer: 59 | Admitting: Urology

## 2019-03-13 ENCOUNTER — Other Ambulatory Visit: Payer: Self-pay

## 2019-03-13 ENCOUNTER — Encounter: Payer: Self-pay | Admitting: Urology

## 2019-03-13 VITALS — BP 166/82 | HR 73 | Ht 68.0 in | Wt 191.0 lb

## 2019-03-13 DIAGNOSIS — R972 Elevated prostate specific antigen [PSA]: Secondary | ICD-10-CM

## 2019-03-13 DIAGNOSIS — C61 Malignant neoplasm of prostate: Secondary | ICD-10-CM

## 2019-03-13 NOTE — Progress Notes (Signed)
03/13/2019 9:39 AM   Brandon Diaz [birth year] [id number]  Referring provider: Baxter Hire, MD [place],  [place] [address]  Chief Complaint  Patient presents with  . Results    HPI: 63 year old male with a history of low risk low-volume prostate cancer on active surveillance returns today for routine after prostate fusion biopsy to review results.  Please see previous notes for prostate cancer history. He was first diagnosed in 2015 followed by confirmatory biopsy in 2016 revealing similar Gleason 3+3, low-volume disease. iPSA 3.1.  Confirmatory biopsy in 2016 showed Gleason 3+3 in 2/12 cores bilateral involving only 1% of each core.  He also has a strong family history of prostate cancer, brother dx and treated around his current same age who underwent radical prostatectomy.  Over the years, his PSA has continued to rise slowly.  He underwent a prostate MRI on 08/15/2018 primarily for surveillance reasons showing a small PI-RADS 3 lesion between the posterior lateral and anterior peripheral zone on the right mid gland and apex without enhancement. No evidence of extracapsular extension, adenopathy or any other suspicious findings. Prostate volume is approximately 45 cc.  At the time, we elected to continue to follow his PSA even though fairly equivocal MRI findings.    Ultimately, his PSA continued to rise up to 6.4.  The decision was made to pursue fusion biopsy targeting the lesions on the above MRI.  He underwent this procedure on 03/01/2019 at [facility] urology.  The region of interest showed Gleason 3+3 in all 3 cores involving up to 70% of the tissue.  He also had an additional 5 of 12 cores bilaterally much lower volume up to 30% in the remainder of his biopsy specimen.  No issues following his most recent biopsy.   PMH: Past Medical History:  Diagnosis Date  . Abnormal prostate specific antigen 12/01/2014  . CA of prostate ([place]) 12/01/2014   followed by urologist   . Decreased libido   . Erectile dysfunction 12/01/2014  . High cholesterol   . Prostate cancer [facility]) 2015   Dr. Erlene Quan    Surgical History: Past Surgical History:  Procedure Laterality Date  . COLONOSCOPY  2014   [facility]  . HERNIA REPAIR  0000000   umilical     Home Medications:  Allergies as of 03/13/2019      Reactions   Apixaban Hives, Itching, Shortness Of Breath, Swelling   Ibuprofen Shortness Of Breath   Sudafed [pseudoephedrine Hcl] Shortness Of Breath   Shortness of breath   Xarelto [rivaroxaban] Hives, Itching, Shortness Of Breath, Swelling      Medication List       Accurate as of March 13, 2019  9:39 AM. If you have any questions, ask your nurse or doctor.        aspirin 325 MG EC tablet Take 325 mg by mouth daily.   BENADRYL ALLERGY PO Take by mouth.   rosuvastatin 10 MG tablet Commonly known as: CRESTOR Take 1 tablet (10 mg total) daily by mouth.       Allergies:  Allergies  Allergen Reactions  . Apixaban Hives, Itching, Shortness Of Breath and Swelling  . Ibuprofen Shortness Of Breath  . Sudafed [Pseudoephedrine Hcl] Shortness Of Breath    Shortness of breath  . Xarelto [Rivaroxaban] Hives, Itching, Shortness Of Breath and Swelling    Family History: Family History  Problem Relation Age of Onset  . Stroke Mother   . Pancreatic cancer Brother   .  Kidney failure Father   . Prostate cancer Brother     Social History:  reports that he quit smoking about 33 years ago. He has never used smokeless tobacco. He reports that he does not drink alcohol or use drugs.  ROS: UROLOGY Frequent Urination?: No Hard to postpone urination?: No Burning/pain with urination?: No Get up at night to urinate?: No Leakage of urine?: No Urine stream starts and stops?: No Trouble starting stream?: No Do you have to strain to urinate?: No Blood in urine?: No Urinary tract infection?: No Sexually transmitted disease?: No Injury to  kidneys or bladder?: No Painful intercourse?: No Weak stream?: No Erection problems?: No Penile pain?: No  Gastrointestinal Nausea?: No Vomiting?: No Indigestion/heartburn?: No Diarrhea?: No Constipation?: No  Constitutional Night sweats?: No Weight loss?: No Fatigue?: No  Skin Skin rash/lesions?: No Itching?: No  Eyes Blurred vision?: No Double vision?: No  Ears/Nose/Throat Sore throat?: No Sinus problems?: No  Hematologic/Lymphatic Swollen glands?: No Easy bruising?: No  Cardiovascular Leg swelling?: No Chest pain?: No  Respiratory Cough?: No Shortness of breath?: No  Endocrine Excessive thirst?: No  Musculoskeletal Back pain?: No Joint pain?: No  Neurological Headaches?: No Dizziness?: No  Psychologic Depression?: No Anxiety?: No  Physical Exam: BP (!) 166/82   Pulse 73   Ht 5\' 8"  (1.727 m)   Wt 191 lb (86.6 kg)   BMI 29.04 kg/m   Constitutional:  Alert and oriented, No acute distress. HEENT: [place] AT, moist mucus membranes.  Trachea midline, no masses. Cardiovascular: No clubbing, cyanosis, or edema. Respiratory: Normal respiratory effort, no increased work of breathing. Skin: No rashes, bruises or suspicious lesions. Neurologic: Grossly intact, no focal deficits, moving all 4 extremities. Psychiatric: Normal mood and affect.  Laboratory Data: Lab Results  Component Value Date   WBC 3.7 (L) 04/25/2018   HGB 15.6 04/25/2018   HCT 47.9 04/25/2018   MCV 89.5 04/25/2018   PLT 180 04/25/2018    Lab Results  Component Value Date   CREATININE 0.93 04/25/2018   Component     Latest Ref Rng & Units 02/06/2015 07/17/2015 01/22/2016 01/19/2017  Prostate Specific Ag, Serum     0.0 - 4.0 ng/mL 3.5 3.3 3.7 4.2 (H)   Component     Latest Ref Rng & Units 01/17/2018 07/16/2018 01/21/2019  Prostate Specific Ag, Serum     0.0 - 4.0 ng/mL 4.6 (H) 5.0 (H) 6.4 (H)    Assessment & Plan:    1. Prostate cancer [facility]) 63 year old male with low risk  prostate cancer now status post recent MRI with fusion biopsy.  This confirms presence of low risk Gleason 3+3 prostate cancer only, no evidence of any high-grade tumor which is reassuring.  Over the course of 5 years, he is likely seen interval increase in volume of his low risk cancer which appears his slowly rising PSA.  Most recent MRI and prostate biopsy is reassuring.  I continue to strongly recommend continued surveillance.  Alternatives including radiation and prostatectomy were also reviewed.  He understands that at some point down the line, he has a strong chance of needing intervention however, at this time he continues to be reasonable candidate for surveillance.  We also discussed today that we have an MRI for baseline for future comparisons as his PSA will likely continue to rise.  This will be helpful tool moving forward to let us know if and when we need to transition to treatment.  He is agreeable this plan.  All questions  were answered.  He was provided a copy of his pathology report today. - PSA; Future  2. Rising PSA level As above  Return in about 6 months (around 09/10/2019) for PSA/ DRE.  Hollice Espy, MD  [facility] [address] [place], [place] [address] [phone]

## 2019-07-19 ENCOUNTER — Other Ambulatory Visit: Payer: 59

## 2019-07-24 ENCOUNTER — Ambulatory Visit: Payer: 59 | Admitting: Urology

## 2019-09-06 ENCOUNTER — Other Ambulatory Visit: Payer: BC Managed Care – PPO

## 2019-09-06 ENCOUNTER — Other Ambulatory Visit: Payer: Self-pay

## 2019-09-06 DIAGNOSIS — C61 Malignant neoplasm of prostate: Secondary | ICD-10-CM

## 2019-09-07 LAB — PSA: Prostate Specific Ag, Serum: 6.8 ng/mL — ABNORMAL HIGH (ref 0.0–4.0)

## 2019-09-10 NOTE — Progress Notes (Signed)
09/11/19 8:17 PM   Brandon Diaz June 23, 1956 [id number]  Referring provider: Baxter Hire, MD [place],  [place] [address]  Chief Complaint  Patient presents with  . Prostate Cancer    HPI: Brandon Diaz is a 64 y.o. African American M with a hx of low risk low volume prostate cancer on active surveillance returns today for routine 6 month f/u.   Please see previous notes for prostate cancer history. He was first diagnosed in 2015 followed by confirmatory biopsy in 2016 revealing similar Gleason 3+3, low-volume disease. iPSA 3.1.  Confirmatory biopsy in 2016 showed Gleason 3+3 in 2/12 cores bilateral involving only 1% of each core.  He also has a strong family history of prostate cancer, brother dx and treated around his current same age who underwent radical prostatectomy.  He underwent a prostate MRI on 08/15/2018 primarily for surveillance reasons showing a small PI-RADS 3 lesion between the posterior lateral and anterior peripheral zone on the right mid gland and apex without enhancement. No evidence of extracapsular extension, adenopathy or any other suspicious findings. Prostate volume is approximately 45 cc.  The decision was made to pursue fusion biopsy targeting the lesions on the above MRI.  He underwent this procedure on 03/01/2019 at [facility] urology. The region of interest showed Gleason 3+3 in all 3 cores involving up to 70% of the tissue.  He also had an additional 5 of 12 cores bilaterally much lower volume up to 30% in the remainder of his biopsy specimen.  He reports of no bothersome urinary symptoms including slow stream, gross hematuria, or UTIs.  Most recent PSA 6.8 as of 09/06/19.    Component     Latest Ref Rng & Units 02/06/2015 07/17/2015 01/22/2016 01/19/2017  Prostate Specific Ag, Serum     0.0 - 4.0 ng/mL 3.5 3.3 3.7 4.2 (H)   Component     Latest Ref Rng & Units 01/17/2018 07/16/2018 01/21/2019 09/06/2019  Prostate Specific Ag,  Serum     0.0 - 4.0 ng/mL 4.6 (H) 5.0 (H) 6.4 (H) 6.8 (H)    PMH: Past Medical History:  Diagnosis Date  . Abnormal prostate specific antigen 12/01/2014  . CA of prostate ([place]) 12/01/2014   followed by urologist   . Decreased libido   . Erectile dysfunction 12/01/2014  . High cholesterol   . Prostate cancer [facility]) 2015   Dr. Erlene Quan    Surgical History: Past Surgical History:  Procedure Laterality Date  . COLONOSCOPY  2014   [facility]  . HERNIA REPAIR  0000000   umilical     Home Medications:  Allergies as of 09/11/2019      Reactions   Apixaban Hives, Itching, Shortness Of Breath, Swelling   Ibuprofen Shortness Of Breath   Sudafed [pseudoephedrine Hcl] Shortness Of Breath   Shortness of breath   Xarelto [rivaroxaban] Hives, Itching, Shortness Of Breath, Swelling      Medication List       Accurate as of September 11, 2019  8:17 PM. If you have any questions, ask your nurse or doctor.        aspirin 325 MG EC tablet Take 325 mg by mouth daily.   BENADRYL ALLERGY PO Take by mouth.   rosuvastatin 10 MG tablet Commonly known as: CRESTOR Take 1 tablet (10 mg total) daily by mouth.       Allergies:  Allergies  Allergen Reactions  . Apixaban Hives, Itching, Shortness Of Breath and Swelling  . Ibuprofen  Shortness Of Breath  . Sudafed [Pseudoephedrine Hcl] Shortness Of Breath    Shortness of breath  . Xarelto [Rivaroxaban] Hives, Itching, Shortness Of Breath and Swelling    Family History: Family History  Problem Relation Age of Onset  . Stroke Mother   . Pancreatic cancer Brother   . Kidney failure Father   . Prostate cancer Brother     Social History:  reports that he quit smoking about 34 years ago. He has never used smokeless tobacco. He reports that he does not drink alcohol or use drugs.   Physical Exam: BP (!) 161/81   Pulse 74   Ht 5\' 8"  (1.727 m)   Wt 195 lb (88.5 kg)   BMI 29.65 kg/m   Constitutional:  Alert and oriented, No acute distress. HEENT:  [place] AT, moist mucus membranes.  Trachea midline, no masses. Cardiovascular: No clubbing, cyanosis, or edema. Respiratory: Normal respiratory effort, no increased work of breathing. Rectal: External hemorrhoid. Normal sphincter tone, 40 g prostate, no nodules or tenderness.  Skin: No rashes, bruises or suspicious lesions. Neurologic: Grossly intact, no focal deficits, moving all 4 extremities. Psychiatric: Normal mood and affect.  Assessment & Plan:    1. Prostate cancer  65 year old male with low risk prostate cancer now status post recent MRI with fusion biopsy.  This confirms presence of low risk Gleason 3+3 prostate cancer only, no evidence of any high-grade tumor which is reassuring.  Over the course of 5 years, he is likely seen interval increase in volume of his low risk cancer which appears his slowly rising PSA.  PSA elevated to 6.8 from previous PSA of 6.4, continue rise  Overall, his work-up has been extensive with recent fusion biopsy confirming increased volume of low risk disease but still remains a good candidate for active surveillance.  We will continue to follow him on a every 6 month basis.  Hold off on further biopsies and MRIs at this time, reassess at next visit.  Recommended active surveillance with PSA in 6 months with virtual visit  2. Rising PSA level As above   F/u 6 months with [facility] [address] [place], [place] [address] [phone]  I, Lucas Mallow, am acting as a scribe for Dr. Hollice Espy,  I have reviewed the above documentation for accuracy and completeness, and I agree with the above.   Hollice Espy, MD

## 2019-09-11 ENCOUNTER — Encounter: Payer: Self-pay | Admitting: Urology

## 2019-09-11 ENCOUNTER — Other Ambulatory Visit: Payer: Self-pay

## 2019-09-11 ENCOUNTER — Ambulatory Visit (INDEPENDENT_AMBULATORY_CARE_PROVIDER_SITE_OTHER): Payer: BC Managed Care – PPO | Admitting: Urology

## 2019-09-11 VITALS — BP 161/81 | HR 74 | Ht 68.0 in | Wt 195.0 lb

## 2019-09-11 DIAGNOSIS — C61 Malignant neoplasm of prostate: Secondary | ICD-10-CM

## 2020-01-23 ENCOUNTER — Other Ambulatory Visit
Admission: RE | Admit: 2020-01-23 | Discharge: 2020-01-23 | Disposition: A | Discharge status: Home / Self Care | Payer: BC Managed Care – PPO | Source: Ambulatory Visit | Attending: Physician Assistant | Admitting: Physician Assistant

## 2020-01-23 DIAGNOSIS — R079 Chest pain, unspecified: Secondary | ICD-10-CM | POA: Insufficient documentation

## 2020-01-23 LAB — TROPONIN I (HIGH SENSITIVITY): Troponin I (High Sensitivity): 4 ng/L (ref <18)

## 2020-03-12 ENCOUNTER — Other Ambulatory Visit: Payer: Self-pay | Admitting: Family Medicine

## 2020-03-12 DIAGNOSIS — C61 Malignant neoplasm of prostate: Secondary | ICD-10-CM

## 2020-03-13 ENCOUNTER — Other Ambulatory Visit: Payer: BC Managed Care – PPO

## 2020-03-13 ENCOUNTER — Other Ambulatory Visit: Payer: Self-pay

## 2020-03-13 DIAGNOSIS — C61 Malignant neoplasm of prostate: Secondary | ICD-10-CM

## 2020-03-14 LAB — PSA: Prostate Specific Ag, Serum: 9 ng/mL — ABNORMAL HIGH (ref 0.0–4.0)

## 2020-03-21 NOTE — Progress Notes (Signed)
Virtual Visit via Video Note  I connected with Brandon Diaz on 03/24/2020 at  3:15 PM EDT by a video enabled telemedicine application and verified that I am speaking with the correct person using two identifiers.  Location: Patient: Home  Provider: Office    I discussed the limitations of evaluation and management by telemedicine and the availability of in person appointments. The patient expressed understanding and agreed to proceed.  History of Present Illness: Brandon Diaz is a 64 y.o. male who presents for a 6 month follow up of prostate cancer and rising PSA level.  He was first diagnosed in 2015 followed by confirmatory biopsy in 2016 revealing similar Gleason 3+3, low-volume disease.iPSA 3.1.Confirmatory biopsy in 2016showed Gleason 3+3 in 2/12 cores bilateral involving only 1% of each core.  He also has a strong family history of prostate cancer, brother dx and treated around his current same age who underwent radical prostatectomy.  He underwent a prostate MRI on 08/15/2018 primarily for surveillance reasons showing a small PI-RADS 3 lesion between the posterior lateral and anterior peripheral zone on the right mid gland and apex without enhancement. No evidence of extracapsular extension, adenopathy or any other suspicious findings. Prostate volume is approximately 45 cc.  The decision was made to pursue fusion biopsy targeting the lesions on the above MRI. He underwent this procedure on 03/01/2019 at [facility] urology. The region of interest showed Gleason 3+3 in all 3 cores involving up to 70% of the tissue. He also had an additional 5 of 12 cores bilaterally much lower volume up to 30% in the remainder of his biopsy specimen.  Recent PSA was 9.0 on 03/13/20.   Patient reports denies changes in urination. He reports before PSA was drawn he had increased urinary symptoms. He took cranberry tablets and symptoms resolved.   PSA trend: Component     Latest Ref Rng &  Units 01/19/2017 01/17/2018 07/16/2018 01/21/2019  Prostate Specific Ag, Serum     0.0 - 4.0 ng/mL 4.2 (H) 4.6 (H) 5.0 (H) 6.4 (H)   Component     Latest Ref Rng & Units 09/06/2019 03/13/2020  Prostate Specific Ag, Serum     0.0 - 4.0 ng/mL 6.8 (H) 9.0 (H)      Observations/Objective: Patient is engaged and asking good questions.   Assessment and Plan:  1. Prostate cancer Low risk prostate cancer now status post recent MRI with fusion biopsy. This confirms presence of low risk Gleason 3+3 prostate cancer only, no evidence of any high-grade tumor which is reassuring.  Over the course of 5 years, he is likely seen interval increase in volume of his low risk cancer which appears his slowly rising PSA.  2. Rising PSA level  Recent PSA was 9.0 on 03/13/20.  Suspect underlying inflammatory component in light of recent acute urinary symptoms (? Prostatitis) which has since resolved RTC in 1 month for PSA. If PSA continues to rise I will plan for a repeat prostate MRI and/or fusion biopsy.   Follow Up Instructions: RTC in 1 month for PSA.   I discussed the assessment and treatment plan with the patient. The patient was provided an opportunity to ask questions and all were answered. The patient agreed with the plan and demonstrated an understanding of the instructions.   The patient was advised to call back or seek an in-person evaluation if the symptoms worsen or if the condition fails to improve as anticipated.  I provided 20 minutes of non-face-to-face time during this  encounter.   Fransico Him, am acting as a scribe for Dr. Hollice Espy.  I have reviewed the above documentation for accuracy and completeness, and I agree with the above.   Hollice Espy, MD

## 2020-03-24 ENCOUNTER — Other Ambulatory Visit: Payer: Self-pay

## 2020-03-24 ENCOUNTER — Telehealth (INDEPENDENT_AMBULATORY_CARE_PROVIDER_SITE_OTHER): Payer: BC Managed Care – PPO | Admitting: Urology

## 2020-03-24 DIAGNOSIS — C61 Malignant neoplasm of prostate: Secondary | ICD-10-CM | POA: Diagnosis not present

## 2020-03-24 NOTE — Progress Notes (Signed)
This service is provided via telemedicine   No vital signs collected/recorded due to the encounter was a telemedicine visit.     Patient consents to a telephone visit:  yes    Names of all persons participating in the telemedicine service and their role in the encounter:  Jorgen Wolfinger, CMA and Ashley Brandon, MD   

## 2020-04-28 ENCOUNTER — Other Ambulatory Visit: Payer: Self-pay

## 2020-04-28 ENCOUNTER — Other Ambulatory Visit: Payer: BC Managed Care – PPO

## 2020-04-28 DIAGNOSIS — C61 Malignant neoplasm of prostate: Secondary | ICD-10-CM

## 2020-04-29 LAB — PSA: Prostate Specific Ag, Serum: 9.3 ng/mL — ABNORMAL HIGH (ref 0.0–4.0)

## 2020-05-07 ENCOUNTER — Other Ambulatory Visit: Payer: Self-pay

## 2020-05-07 ENCOUNTER — Ambulatory Visit (INDEPENDENT_AMBULATORY_CARE_PROVIDER_SITE_OTHER): Payer: BC Managed Care – PPO | Admitting: Gastroenterology

## 2020-05-07 ENCOUNTER — Encounter: Payer: Self-pay | Admitting: Gastroenterology

## 2020-05-07 VITALS — BP 155/76 | HR 81 | Temp 97.3°F | Ht 68.0 in | Wt 193.2 lb

## 2020-05-07 DIAGNOSIS — K921 Melena: Secondary | ICD-10-CM | POA: Diagnosis not present

## 2020-05-07 NOTE — Progress Notes (Signed)
Gastroenterology Consultation  Referring Provider:     Baxter Hire, MD Primary Care Physician:  Baxter Hire, MD Primary Gastroenterologist:  Dr. Allen Norris     Reason for Consultation:     Rectal bleeding        HPI:   Brandon Diaz is a 64 y.o. y/o male referred for consultation & management of Rectal bleeding by Dr. Edwina Barth, Chrystie Nose, MD.  This patient comes in today after asking his primary care provider for referral due to rectal bleeding.  The patient was concerned that the rectal bleeding may not be coming from hemorrhoids.  The patient reports the blood to be bright red.  Past Medical History:  Diagnosis Date  . Abnormal prostate specific antigen 12/01/2014  . CA of prostate ([place]) 12/01/2014   followed by urologist   . Decreased libido   . Erectile dysfunction 12/01/2014  . High cholesterol   . Prostate cancer [facility]) 2015   Dr. Erlene Quan   The patient denies having any radiation to his prostate as treatment for his prostate cancer. He also reports that he felt like he had a hemorrhoid that popped that caused all this problem. He denies any blood in the water or mixed with the stools and states it is only on the toilet paper. There is no report of any change in bowel habits black stools or abnormal weight loss.  Past Surgical History:  Procedure Laterality Date  . COLONOSCOPY  2014   [facility]  . HERNIA REPAIR  2409   umilical     Prior to Admission medications   Medication Sig Start Date End Date Taking? Authorizing Provider  rosuvastatin (CRESTOR) 10 MG tablet Take 1 tablet (10 mg total) daily by mouth. 05/11/17   Roselee Nova, MD    Family History  Problem Relation Age of Onset  . Stroke Mother   . Pancreatic cancer Brother   . Kidney failure Father   . Prostate cancer Brother      Social History   Tobacco Use  . Smoking status: Former Smoker    Quit date: 07/02/1985    Years since quitting: 34.8  . Smokeless tobacco: Never Used  Vaping Use  . Vaping  Use: Never used  Substance Use Topics  . Alcohol use: No    Alcohol/week: 0.0 standard drinks  . Drug use: No    Allergies as of 05/07/2020 - Review Complete 03/24/2020  Allergen Reaction Noted  . Apixaban Hives, Itching, Shortness Of Breath, and Swelling 07/06/2018  . Ibuprofen Shortness Of Breath 02/19/2014  . Sudafed [pseudoephedrine hcl] Shortness Of Breath 02/19/2014  . Xarelto [rivaroxaban] Hives, Itching, Shortness Of Breath, and Swelling 07/06/2018    Review of Systems:    All systems reviewed and negative except where noted in HPI.   Physical Exam:  There were no vitals taken for this visit. No LMP for male patient. General:   Alert,  Well-developed, well-nourished, pleasant and cooperative in NAD Head:  Normocephalic and atraumatic. Eyes:  Sclera clear, no icterus.   Conjunctiva pink. Ears:  Normal auditory acuity. Neck:  Supple; no masses or thyromegaly. Lungs:  Respirations even and unlabored.  Clear throughout to auscultation.   No wheezes, crackles, or rhonchi. No acute distress. Heart:  Regular rate and rhythm; no murmurs, clicks, rubs, or gallops. Abdomen:  Normal bowel sounds.  No bruits.  Soft, non-tender and non-distended without masses, hepatosplenomegaly or hernias noted.  No guarding or rebound tenderness.  Negative Carnett sign.  Rectal:  Deferred.  Pulses:  Normal pulses noted. Extremities:  No clubbing or edema.  No cyanosis. Neurologic:  Alert and oriented x3;  grossly normal neurologically. Skin:  Intact without significant lesions or rashes.  No jaundice. Lymph Nodes:  No significant cervical adenopathy. Psych:  Alert and cooperative. Normal mood and affect.  Imaging Studies: No results found.  Assessment and Plan:   [name] is a 64 y.o. y/o male who had a colonoscopy in 2015 that was normal. The patient now comes in with bright red blood per rectum that only happens when he moves his bowels. He states that he has the bleeding more  often than not. The patient has no blood in the toilet water or mixed with the stools but only on the toilet paper. The patient has been offered a colonoscopy or hemorrhoidal banding. The patient will be set up for hemorrhoidal banding.  The patient has been told that if the bleeding continues after hemorrhoidal banding then he may want to consider doing a colonoscopy. The patient has been explained the plan agrees with it.    Lucilla Lame, MD. Marval Regal    Note: This dictation was prepared with Dragon dictation along with smaller phrase technology. Any transcriptional errors that result from this process are unintentional.

## 2020-05-15 ENCOUNTER — Encounter: Payer: Self-pay | Admitting: Gastroenterology

## 2020-05-15 ENCOUNTER — Ambulatory Visit (INDEPENDENT_AMBULATORY_CARE_PROVIDER_SITE_OTHER): Payer: BC Managed Care – PPO | Admitting: Gastroenterology

## 2020-05-15 ENCOUNTER — Other Ambulatory Visit: Payer: Self-pay

## 2020-05-15 VITALS — BP 149/77 | HR 84 | Temp 97.7°F | Ht 68.0 in | Wt 190.0 lb

## 2020-05-15 DIAGNOSIS — K641 Second degree hemorrhoids: Secondary | ICD-10-CM | POA: Diagnosis not present

## 2020-05-15 DIAGNOSIS — K625 Hemorrhage of anus and rectum: Secondary | ICD-10-CM | POA: Diagnosis not present

## 2020-05-15 NOTE — Progress Notes (Signed)

## 2020-05-15 NOTE — Progress Notes (Signed)
Brandon Darby, MD [address]  [place]  [place], [place] [address]  Main: [phone]  Fax: [phone]    Gastroenterology Consultation  Referring Provider:     Baxter Hire, MD Primary Care Physician:  Baxter Hire, MD Primary Gastroenterologist:  Dr. Lucilla Lame Reason for Consultation:     Rectal bleeding, prolapse        HPI:   Brandon Diaz is a 64 y.o. male referred by Dr. Edwina Barth, Chrystie Nose, MD  for consultation & management of rectal bleeding.  Patient reports that he has been experiencing painless rectal bleeding for last several years, lately has become more or less constant almost with every bowel movement, he notices on wiping only.  He denies constipation or diarrhea.  He reports that he had felt hemorrhoid that popped and has been bleeding since then.  He reports some rectal pressure as well as prolapse of the hemorrhoid after each bowel movement and recedes spontaneously.  His hemoglobin is normal.  He does have history of low-grade prostate cancer, currently under surveillance.  Patient underwent colonoscopy by Dr. Allen Norris at [facility] in 2014 that was normal Patient denies any other GI symptoms, constitutional symptoms  He does not smoke or drink alcohol He works for [employer]  NSAIDs: None  Antiplts/Anticoagulants/Anti thrombotics: None  GI Procedures: Colonoscopy in 2014 at [facility], normal Patient denies family history of GI malignancy  Past Medical History:  Diagnosis Date  . Abnormal prostate specific antigen 12/01/2014  . CA of prostate ([place]) 12/01/2014   followed by urologist   . Decreased libido   . Erectile dysfunction 12/01/2014  . High cholesterol   . Prostate cancer [facility]) 2015   Dr. Erlene Quan    Past Surgical History:  Procedure Laterality Date  . COLONOSCOPY  2014   [facility]  . HERNIA REPAIR  5974   umilical     Current Outpatient Medications:  .  rosuvastatin (CRESTOR) 10 MG tablet, Take 1 tablet (10 mg total) daily by mouth.,  Disp: 90 tablet, Rfl: 0   Family History  Problem Relation Age of Onset  . Stroke Mother   . Pancreatic cancer Brother   . Kidney failure Father   . Prostate cancer Brother      Social History   Tobacco Use  . Smoking status: Former Smoker    Quit date: 07/02/1985    Years since quitting: 34.8  . Smokeless tobacco: Never Used  Vaping Use  . Vaping Use: Never used  Substance Use Topics  . Alcohol use: No    Alcohol/week: 0.0 standard drinks  . Drug use: No    Allergies as of 05/15/2020 - Review Complete 05/15/2020  Allergen Reaction Noted  . Apixaban Hives, Itching, Shortness Of Breath, and Swelling 07/06/2018  . Ibuprofen Shortness Of Breath 02/19/2014  . Sudafed [pseudoephedrine hcl] Shortness Of Breath 02/19/2014  . Xarelto [rivaroxaban] Hives, Itching, Shortness Of Breath, and Swelling 07/06/2018    Review of Systems:    All systems reviewed and negative except where noted in HPI.   Physical Exam:  BP (!) 149/77 (BP Location: Left Arm, Patient Position: Sitting, Cuff Size: Normal)   Pulse 84   Temp 97.7 F (36.5 C) (Oral)   Ht 5\' 8"  (1.727 m)   Wt 190 lb (86.2 kg)   BMI 28.89 kg/m  No LMP for male patient.  General:   Alert,  Well-developed, well-nourished, pleasant and cooperative in NAD Head:  Normocephalic and atraumatic. Eyes:  Sclera  clear, no icterus.   Conjunctiva pink. Ears:  Normal auditory acuity. Nose:  No deformity, discharge, or lesions. Mouth:  No deformity or lesions,oropharynx pink & moist. Neck:  Supple; no masses or thyromegaly. Lungs:  Respirations even and unlabored.  Clear throughout to auscultation.   No wheezes, crackles, or rhonchi. No acute distress. Heart:  Regular rate and rhythm; no murmurs, clicks, rubs, or gallops. Abdomen:  Normal bowel sounds. Soft, non-tender and non-distended without masses, hepatosplenomegaly or hernias noted.  No guarding or rebound tenderness.   Rectal: Small perianal skin tag, nontender digital rectal  exam, palpable soft tissue in the anal canal, anoscopy was performed which revealed prolapse of the external hemorrhoids Msk:  Symmetrical without gross deformities. Good, equal movement & strength bilaterally. Pulses:  Normal pulses noted. Extremities:  No clubbing or edema.  No cyanosis. Neurologic:  Alert and oriented x3;  grossly normal neurologically. Skin:  Intact without significant lesions or rashes. No jaundice. Psych:  Alert and cooperative. Normal mood and affect.  Imaging Studies: No abdominal imaging  Assessment and Plan:   [name] is a 64 y.o. male with no past medical history is seen in consultation for painless rectal bleeding with prolapse as well as rectal pressure  Grade 2 hemorrhoids with rectal bleeding Discussed with patient today regarding repeat colonoscopy before hemorrhoid ligation.  Patient prefers to undergo hemorrhoid ligation first.  Discussed about the procedure, risks and benefits of hemorrhoid ligation Consent obtained Perform hemorrhoid ligation today    Follow up in 2 weeks   Brandon Darby, MD

## 2020-06-02 ENCOUNTER — Encounter: Payer: Self-pay | Admitting: Gastroenterology

## 2020-06-02 ENCOUNTER — Ambulatory Visit (INDEPENDENT_AMBULATORY_CARE_PROVIDER_SITE_OTHER): Payer: BC Managed Care – PPO | Admitting: Gastroenterology

## 2020-06-02 VITALS — BP 151/83 | HR 76 | Temp 98.1°F | Ht 68.0 in | Wt 191.5 lb

## 2020-06-02 DIAGNOSIS — K641 Second degree hemorrhoids: Secondary | ICD-10-CM

## 2020-06-02 DIAGNOSIS — K625 Hemorrhage of anus and rectum: Secondary | ICD-10-CM

## 2020-06-02 NOTE — Progress Notes (Addendum)
PROCEDURE NOTE: The patient presents with symptomatic grade 2 hemorrhoids, unresponsive to maximal medical therapy, requesting rubber band ligation of his/her hemorrhoidal disease.  All risks, benefits and alternative forms of therapy were described and informed consent was obtained.   The decision was made to band the RA internal hemorrhoid, and the [place] was used to perform band ligation without complication.  Digital anorectal examination was then performed to assure proper positioning of the band, and to adjust the banded tissue as required.  The patient was discharged home without pain or other issues.  Dietary and behavioral recommendations were given and (if necessary - prescriptions were given), along with follow-up instructions.  The patient will return 2 weeks for follow-up and possible additional banding as required.  No complications were encountered and the patient tolerated the procedure well.  Patient has perianal skin tag which needs to be surgically removed after hemorrhoid ligation  Cephas Darby, MD [address]  [place]  [place], [place] [address]  Main: [phone]  Fax: [phone] Pager: [phone]

## 2020-06-12 ENCOUNTER — Telehealth: Payer: Self-pay

## 2020-06-12 NOTE — Telephone Encounter (Signed)
-----   Message from Hollice Espy, MD sent at 06/12/2020 10:27 AM EST ----- It does not look like Brandon Diaz has any scheduled follow-up with me.  His PSA is still rising.  As we previously discussed, would like him to get an MRI of his prostate, perhaps in February which will be 2 years from his previous one and follow-up with me thereafter.  Please order an MRI of the prostate with and without.  Also, please schedule follow-up with me in February to discuss results.  Hollice Espy, MD

## 2020-06-15 ENCOUNTER — Telehealth: Payer: Self-pay | Admitting: *Deleted

## 2020-06-15 DIAGNOSIS — C61 Malignant neoplasm of prostate: Secondary | ICD-10-CM

## 2020-06-15 NOTE — Telephone Encounter (Addendum)
Patient advised, voiced understanding.  Ordered MRI and scheduled follow up.  ----- Message from Hollice Espy, MD sent at 06/12/2020 10:27 AM EST ----- It does not look like Brandon Diaz has any scheduled follow-up with me.  His PSA is still rising.  As we previously discussed, would like him to get an MRI of his prostate, perhaps in February which will be 2 years from his previous one and follow-up with me thereafter.  Please order an MRI of the prostate with and without.  Also, please schedule follow-up with me in February to discuss results.  Hollice Espy, MD

## 2020-06-30 ENCOUNTER — Encounter: Payer: Self-pay | Admitting: Gastroenterology

## 2020-06-30 ENCOUNTER — Ambulatory Visit (INDEPENDENT_AMBULATORY_CARE_PROVIDER_SITE_OTHER): Payer: BC Managed Care – PPO | Admitting: Gastroenterology

## 2020-06-30 VITALS — BP 159/82 | HR 90 | Temp 98.0°F | Ht 68.0 in | Wt 195.0 lb

## 2020-06-30 DIAGNOSIS — K625 Hemorrhage of anus and rectum: Secondary | ICD-10-CM | POA: Diagnosis not present

## 2020-06-30 DIAGNOSIS — K641 Second degree hemorrhoids: Secondary | ICD-10-CM | POA: Diagnosis not present

## 2020-06-30 NOTE — Progress Notes (Signed)
PROCEDURE NOTE: The patient presents with symptomatic grade 2 hemorrhoids, unresponsive to maximal medical therapy, requesting rubber band ligation of his/her hemorrhoidal disease.  All risks, benefits and alternative forms of therapy were described and informed consent was obtained.  The decision was made to band the LL internal hemorrhoid, and the [facility] was used to perform band ligation without complication.  Digital anorectal examination was then performed to assure proper positioning of the band, and to adjust the banded tissue as required.  The patient was discharged home without pain or other issues.  Dietary and behavioral recommendations were given and (if necessary - prescriptions were given), along with follow-up instructions.  The patient will return as needed for follow-up and possible additional banding as required.  No complications were encountered and the patient tolerated the procedure well.  Patient has perianal skin tag which needs to be surgically removed after hemorrhoid ligation  Arlyss Repress, MD [address]  [address]  [place], [place] [address]  Main: [phone]  Fax: [phone] Pager: [phone]

## 2020-07-30 ENCOUNTER — Ambulatory Visit
Admission: RE | Admit: 2020-07-30 | Discharge: 2020-07-30 | Disposition: A | Discharge status: Home / Self Care | Payer: BC Managed Care – PPO | Source: Ambulatory Visit | Attending: Urology | Admitting: Urology

## 2020-07-30 ENCOUNTER — Other Ambulatory Visit: Payer: Self-pay

## 2020-07-30 DIAGNOSIS — C61 Malignant neoplasm of prostate: Secondary | ICD-10-CM | POA: Diagnosis present

## 2020-07-30 MED ORDER — GADOBUTROL 1 MMOL/ML IV SOLN
8.0000 mL | Freq: Once | INTRAVENOUS | Status: AC | PRN
  Administered 2020-07-30: 8 mL via INTRAVENOUS

## 2020-08-11 ENCOUNTER — Other Ambulatory Visit: Payer: Self-pay

## 2020-08-11 ENCOUNTER — Ambulatory Visit (INDEPENDENT_AMBULATORY_CARE_PROVIDER_SITE_OTHER): Payer: BC Managed Care – PPO | Admitting: Urology

## 2020-08-11 ENCOUNTER — Encounter: Payer: Self-pay | Admitting: Urology

## 2020-08-11 VITALS — BP 174/81 | HR 79

## 2020-08-11 DIAGNOSIS — C61 Malignant neoplasm of prostate: Secondary | ICD-10-CM | POA: Diagnosis not present

## 2020-08-11 NOTE — Progress Notes (Signed)
08/11/2020 5:30 PM   Brandon Diaz May 04, 1956 [id number]  Referring provider: Baxter Hire, MD [place],  [place] [address]  Chief Complaint  Patient presents with  . Prostate Cancer    HPI: 65 year old male with personal history of low risk prostate cancer active surveillance recently noted to have a rising PSA repeat prostate MRI.  He returns today to discuss these results as well as discuss further management options.  He was first diagnosed in 2015 followed by confirmatory biopsy in 2016 revealing similar Gleason 3+3, low-volume disease.iPSA 3.1.Confirmatory biopsy in 2016showed Gleason 3+3 in 2/12 cores bilateral involving only 1% of each core.  He also has a strong family history of prostate cancer, brother dx and treated around his current same age who underwent radical prostatectomy.  He underwent a prostate MRI on 08/15/2018 primarily for surveillance reasons showing a small PI-RADS 3 lesion between the posterior lateral and anterior peripheral zone on the right mid gland and apex without enhancement. No evidence of extracapsular extension, adenopathy or any other suspicious findings. Prostate volume is approximately 45 cc.  The decision was made to pursue fusion biopsy targeting the lesions on the above MRI. He underwent this procedure on 03/01/2019 at [facility]. The region of interest showed Gleason 3+3 in all 3 cores involving up to 70% of the tissue. He also had an additional 5 of 12 cores bilaterally much lower volume up to 30% in the remainder of his biopsy specimen.  In the setting of upward trending PSA as outlined below, he underwent repeat prostate MRI completed on 07/30/2020.  This indicated now to PI-RADS 3 lesions similarly in the right mid gland as previously as well as a new one in the left peripheral zone in the mid gland.  There is no evidence of extracapsular extension, seminal vesicle involvement, or evidence of metastatic  disease.  Prostate volume remains essentially stable 44 g with changes consistent with BPH.  Overall, symptoms are stable.  He denies any significant issues.  Anxious about the findings today  Component     Latest Ref Rng & Units 01/22/2016 01/19/2017 01/17/2018 07/16/2018  Prostate Specific Ag, Serum     0.0 - 4.0 ng/mL 3.7 4.2 (H) 4.6 (H) 5.0 (H)   Component     Latest Ref Rng & Units 01/21/2019 09/06/2019 03/13/2020 04/28/2020  Prostate Specific Ag, Serum     0.0 - 4.0 ng/mL 6.4 (H) 6.8 (H) 9.0 (H) 9.3 (H)     PMH: Past Medical History:  Diagnosis Date  . Abnormal prostate specific antigen 12/01/2014  . CA of prostate ([place]) 12/01/2014   followed by urologist   . Decreased libido   . Erectile dysfunction 12/01/2014  . High cholesterol   . Prostate cancer [facility]) 2015   Dr. Erlene Quan    Surgical History: Past Surgical History:  Procedure Laterality Date  . COLONOSCOPY  2014   [facility]  . HERNIA REPAIR  7564   umilical     Home Medications:  Allergies as of 08/11/2020      Reactions   Apixaban Hives, Itching, Shortness Of Breath, Swelling   Ibuprofen Shortness Of Breath   Sudafed [pseudoephedrine Hcl] Shortness Of Breath   Shortness of breath   Xarelto [rivaroxaban] Hives, Itching, Shortness Of Breath, Swelling      Medication List       Accurate as of August 11, 2020  5:30 PM. If you have any questions, ask your nurse or doctor.  rosuvastatin 10 MG tablet Commonly known as: CRESTOR Take 1 tablet (10 mg total) daily by mouth.       Allergies:  Allergies  Allergen Reactions  . Apixaban Hives, Itching, Shortness Of Breath and Swelling  . Ibuprofen Shortness Of Breath  . Sudafed [Pseudoephedrine Hcl] Shortness Of Breath    Shortness of breath  . Xarelto [Rivaroxaban] Hives, Itching, Shortness Of Breath and Swelling    Family History: Family History  Problem Relation Age of Onset  . Stroke Mother   . Pancreatic cancer Brother   . Kidney failure Father   .  Prostate cancer Brother     Social History:  reports that he quit smoking about 35 years ago. He has never used smokeless tobacco. He reports that he does not drink alcohol and does not use drugs.   Physical Exam: BP (!) 174/81   Pulse 79   Constitutional:  Alert and oriented, No acute distress. HEENT: [place] AT, moist mucus membranes.  Trachea midline, no masses. Cardiovascular: No clubbing, cyanosis, or edema. Respiratory: Normal respiratory effort, no increased work of breathing. Skin: No rashes, bruises or suspicious lesions. Neurologic: Grossly intact, no focal deficits, moving all 4 extremities. Psychiatric: Normal mood and affect.   Pertinent Imaging: Prostate MRI imaging was personally reviewed today and compared to his previous.  Agree with radiologic interpretation.   Assessment & Plan:    1. Prostate cancer ([place]) Low risk prostate cancer more recently with rising PSA as outlined above  Prostate MRI was reviewed again today, there appears to be possibly mild progression on MR with a newer lesion, now PI-RADS 3 instead of 2 although clinical significance of this is relatively unknown  Reassuringly, no evidence of metastatic or locally advanced disease  After lengthy discussion today, we agreed to repeat his PSA today and if it is going down, we will continue to follow him clinically but if it stable or more elevated, he is agreeable for repeat prostate fusion biopsy in [place].  He understands the risk of bleeding, infection amongst others.  Has had this multiple times in the past now.  He may desire to do this with nitrous oxide if possible.  We will call him tomorrow with recommendations.  No longer on blood thinners. - PSA; Future - PSA    Hollice Espy, MD  [facility] [address], [place], [place] [address] [phone]  I spent 30 total minutes on the day of the encounter including pre-visit review of the medical  record, face-to-face time with the patient, and post visit ordering of labs/imaging/tests.

## 2020-08-12 LAB — PSA: Prostate Specific Ag, Serum: 13.1 ng/mL — ABNORMAL HIGH (ref 0.0–4.0)

## 2020-08-13 ENCOUNTER — Telehealth: Payer: Self-pay | Admitting: *Deleted

## 2020-08-13 DIAGNOSIS — R972 Elevated prostate specific antigen [PSA]: Secondary | ICD-10-CM

## 2020-08-13 NOTE — Telephone Encounter (Addendum)
Verified DPR-spoke with patient spouse-referral placed for fusion biopsy. Voiced understanding.   ----- Message from Hollice Espy, MD sent at 08/13/2020 11:54 AM EST ----- PSA keeps going up, now up to 13.1.  I think is time to bite the bullet and pursue another fusion biopsy.  Please set this up for in [place] and have him follow-up with me thereafter.  We discussed this in clinic as an option if his PSA is rising.  Hollice Espy, MD

## 2020-09-07 ENCOUNTER — Other Ambulatory Visit: Payer: Self-pay

## 2020-09-07 ENCOUNTER — Emergency Department: Payer: BC Managed Care – PPO

## 2020-09-07 ENCOUNTER — Emergency Department
Admission: EM | Admit: 2020-09-07 | Discharge: 2020-09-07 | Disposition: A | Discharge status: Home / Self Care | Payer: BC Managed Care – PPO | Attending: Emergency Medicine | Admitting: Emergency Medicine

## 2020-09-07 DIAGNOSIS — Z8546 Personal history of malignant neoplasm of prostate: Secondary | ICD-10-CM | POA: Insufficient documentation

## 2020-09-07 DIAGNOSIS — Z86718 Personal history of other venous thrombosis and embolism: Secondary | ICD-10-CM | POA: Insufficient documentation

## 2020-09-07 DIAGNOSIS — I80292 Phlebitis and thrombophlebitis of other deep vessels of left lower extremity: Secondary | ICD-10-CM | POA: Insufficient documentation

## 2020-09-07 DIAGNOSIS — I803 Phlebitis and thrombophlebitis of lower extremities, unspecified: Secondary | ICD-10-CM

## 2020-09-07 DIAGNOSIS — M79662 Pain in left lower leg: Secondary | ICD-10-CM | POA: Diagnosis present

## 2020-09-07 DIAGNOSIS — Z87891 Personal history of nicotine dependence: Secondary | ICD-10-CM | POA: Diagnosis not present

## 2020-09-07 HISTORY — DX: Acute embolism and thrombosis of unspecified deep veins of unspecified lower extremity: I82.409

## 2020-09-07 MED ORDER — ENOXAPARIN SODIUM 80 MG/0.8ML ~~LOC~~ SOLN: 80.0000 mg | Freq: Two times a day (BID) | SUBCUTANEOUS | 0 refills | Status: DC

## 2020-09-07 MED ORDER — ENOXAPARIN SODIUM 80 MG/0.8ML ~~LOC~~ SOLN
80.0000 mg | Freq: Once | SUBCUTANEOUS | Status: AC
  Administered 2020-09-07: 80 mg via SUBCUTANEOUS
  Filled 2020-09-07: qty 0.8

## 2020-09-07 NOTE — ED Provider Notes (Signed)
[facility] Emergency Department Provider Note  ____________________________________________   Event Date/Time   First MD Initiated Contact with Patient 09/07/20 1040     (approximate)  I have reviewed the triage vital signs and the nursing notes.   HISTORY  Chief Complaint Leg Pain    HPI Brandon Diaz is a 65 y.o. male presents emergency department complaining of left lower calf pain.  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  States yesterday felt a sting in the calf and now has some swelling.  No fever chills.  No chest pain or shortness of breath.  No known injury    Past Medical History:  Diagnosis Date  . Abnormal prostate specific antigen 12/01/2014  . CA of prostate ([place]) 12/01/2014   followed by urologist   . Decreased libido   . DVT (deep venous thrombosis) ([place])   . Erectile dysfunction 12/01/2014  . High cholesterol   . Prostate cancer [facility]) 2015   Dr. Erlene Quan    Patient Active Problem List   Diagnosis Date Noted  . Acute deep vein thrombosis (DVT) of distal vein of right lower extremity ([place]) 04/18/2018  . Elevated blood pressure reading 04/18/2018  . Leukopenia 05/26/2017  . 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 Productive cough 03/29/2016  . History of umbilical hernia repair 30/16/0109  . Abnormal prostate specific antigen 12/01/2014  . CA of prostate ([place]) 12/01/2014  . Screening for human immunodeficiency virus 12/01/2014  . Erectile dysfunction 12/01/2014  . HLD (hyperlipidemia) 03/26/2010  . Decreased libido 07/16/2008    Past Surgical History:  Procedure Laterality Date  . COLONOSCOPY  2014   [facility]  . HERNIA REPAIR  [id number]   umilical     Prior to Admission medications   Medication Sig Start Date End Date Taking? Authorizing Provider  enoxaparin (LOVENOX) 80 MG/0.8ML injection Inject 0.8 mLs (80 mg total) into the skin 2 (two) times daily for 7 days. 09/07/20 09/14/20 Yes Antowan Samford, Linden Dolin, PA-C  rosuvastatin  (CRESTOR) 10 MG tablet Take 1 tablet (10 mg total) daily by mouth. 05/11/17   Roselee Nova, MD    Allergies Apixaban, Ibuprofen, Sudafed [pseudoephedrine hcl], and Xarelto [rivaroxaban]  Family History  Problem Relation Age of Onset  . Stroke Mother   . Pancreatic cancer Brother   . Kidney failure Father   . Prostate cancer Brother     Social History Social History   Tobacco Use  . Smoking status: Former Smoker    Quit date: 07/02/1985    Years since quitting: 35.2  . Smokeless tobacco: Never Used  Vaping Use  . Vaping Use: Never used  Substance Use Topics  . Alcohol use: No    Alcohol/week: 0.0 standard drinks  . Drug use: No    Review of Systems  Constitutional: No fever/chills Eyes: No visual changes. ENT: No sore throat. Respiratory: Denies cough Cardiovascular: Denies chest pain Gastrointestinal: Denies abdominal pain Genitourinary: Negative for dysuria. Musculoskeletal: Negative for back pain.  Positive left lower leg pain Skin: Negative for rash. Psychiatric: no mood changes,     ____________________________________________   PHYSICAL EXAM:  VITAL SIGNS: ED Triage Vitals  Enc Vitals Group     BP 09/07/20 1002 (!) 145/85     Pulse Rate 09/07/20 1002 72     Resp 09/07/20 1002 18     Temp 09/07/20 1002 97.8 F (36.6 C)     Temp Source 09/07/20 1002 Oral     SpO2 --  Weight 09/07/20 1004 190 lb (86.2 kg)     Height 09/07/20 1004 5\' 8"  (1.727 m)     Head Circumference --      Peak Flow --      Pain Score 09/07/20 1004 0     Pain Loc --      Pain Edu? --      Excl. in [place]? --     Constitutional: Alert and oriented. Well appearing and in no acute distress. Eyes: Conjunctivae are normal.  Head: Atraumatic. Nose: No congestion/rhinnorhea. Mouth/Throat: Mucous membranes are moist.   Neck:  supple no lymphadenopathy noted Cardiovascular: Normal rate, regular rhythm. Heart sounds are normal Respiratory: Normal respiratory effort.  No  retractions, lungs c t a  GU: deferred Musculoskeletal: FROM all extremities, warm and well perfused, cordlike area noted on the posterior calf on the left leg, neurovascular is intact, area is tender Neurologic:  Normal speech and language.  Skin:  Skin is warm, dry and intact. No rash noted. Psychiatric: Mood and affect are normal. Speech and behavior are normal.  ____________________________________________   LABS (all labs ordered are listed, but only abnormal results are displayed)  Labs Reviewed - No data to display ____________________________________________   ____________________________________________  RADIOLOGY  Ultrasound left lower extremity  ____________________________________________   PROCEDURES  Procedure(s) performed: Lovenox 80 units subcu  Procedures    ____________________________________________   INITIAL IMPRESSION / ASSESSMENT AND PLAN / ED COURSE  Pertinent labs & imaging results that were available during my care of the patient were reviewed by me and considered in my medical decision making (see chart for details).   Patient 65 year old male presents with concerns of DVT.  Patient appears to be stable  DDx: DVT, cellulitis, varicose vein  Ultrasound left lower extremity ordered  Ultrasound shows thrombophlebitis.  I did discuss this with Dr. Kerman Passey.  Due to the fact that the patient cannot take Xarelto, Eliquis, ibuprofen or aspirin had concerns due to his previous history of DVT.  We will place him on Lovenox and he can follow-up with his regular doctor for Coumadin/warfarin.  I did send a secure message to his regular physician Dr. Edwina Barth.  He is to call him if he has not heard back from him within 24 to 48 hours.  He is to buy some compressions socks.  He was given a few days off of work.  Discharged in stable condition with strict precautions to return if worsening.     Brandon Diaz was evaluated in Emergency Department on  09/07/2020 for the symptoms described in the history of present illness. He was evaluated in the context of the global COVID-19 pandemic, which necessitated consideration that the patient might be at risk for infection with the SARS-CoV-2 virus that causes COVID-19. Institutional protocols and algorithms that pertain to the evaluation of patients at risk for COVID-19 are in a state of rapid change based on information released by regulatory bodies including the CDC and federal and state organizations. These policies and algorithms were followed during the patient's care in the ED.    As part of my medical decision making, I reviewed the following data within the [place] History obtained from family, Nursing notes reviewed and incorporated, Old chart reviewed, Radiograph reviewed , Notes from prior ED visits and [place] Controlled Substance Database  ____________________________________________   FINAL CLINICAL IMPRESSION(S) / ED DIAGNOSES  Final diagnoses:  Thrombophlebitis leg      NEW MEDICATIONS STARTED DURING THIS VISIT:  Discharge  Medication List as of 09/07/2020 12:29 PM    START taking these medications   Details  enoxaparin (LOVENOX) 80 MG/0.8ML injection Inject 0.8 mLs (80 mg total) into the skin 2 (two) times daily for 7 days., Starting Mon 09/07/2020, Until Mon 09/14/2020, Normal         Note:  This document was prepared using Dragon voice recognition software and may include unintentional dictation errors.    Versie Starks, PA-C 09/07/20 1419    Harvest Dark, MD 09/07/20 1450

## 2020-09-07 NOTE — ED Notes (Signed)
See triage note  Presents with pain to left lower leg  Stats he felt a "sting" to posterior calf with some swelling  States he has had a DVT in past

## 2020-09-07 NOTE — ED Triage Notes (Signed)
Pt sent from [facility] with c/o left posterior calf pain and swelling since last night. HX of DVT

## 2020-09-07 NOTE — Discharge Instructions (Signed)
Follow-up with Dr. Edwina Barth.  Please call for an appointment.  I did send him a secure message detailing our plan today.  Start taking the Lovenox.  If you have not heard from Dr. Wynetta Emery by tomorrow please call him to discuss the Coumadin/warfarin.  Buy compression stockings.

## 2020-09-10 DIAGNOSIS — I803 Phlebitis and thrombophlebitis of lower extremities, unspecified: Secondary | ICD-10-CM | POA: Insufficient documentation

## 2020-09-15 ENCOUNTER — Encounter (INDEPENDENT_AMBULATORY_CARE_PROVIDER_SITE_OTHER): Payer: Self-pay | Admitting: Vascular Surgery

## 2020-09-15 ENCOUNTER — Ambulatory Visit (INDEPENDENT_AMBULATORY_CARE_PROVIDER_SITE_OTHER): Payer: BC Managed Care – PPO | Admitting: Vascular Surgery

## 2020-09-15 ENCOUNTER — Other Ambulatory Visit: Payer: Self-pay

## 2020-09-15 VITALS — BP 162/89 | HR 86 | Resp 16 | Ht 68.0 in | Wt 190.0 lb

## 2020-09-15 DIAGNOSIS — I803 Phlebitis and thrombophlebitis of lower extremities, unspecified: Secondary | ICD-10-CM

## 2020-09-15 DIAGNOSIS — E7800 Pure hypercholesterolemia, unspecified: Secondary | ICD-10-CM

## 2020-09-15 NOTE — Assessment & Plan Note (Signed)
Patient has superficial thrombophlebitis of a small saphenous vein branch in the posterior calf and popliteal fossa area extending into the saphenous vein but not going into the thigh.  This was treated with full anticoagulation, but at this point, it appears to have clinically resolved and I would be perfectly okay not having any anticoagulation going forward.  Warm compresses and elevation can be used for symptomatic relief if symptoms recur.  I will plan on seeing him back in about a month but will be happy to see him sooner if any problems develop in the interim.

## 2020-09-15 NOTE — Progress Notes (Signed)
Patient ID: Brandon Diaz, male   DOB: [birth year], 65 y.o.   MRN: [id number]  Chief Complaint  Patient presents with   New Patient (Initial Visit)    Ref Edwina Barth thrombophlebitis with h/o dvt    HPI Brandon Diaz is a 65 y.o. male.  I am asked to see the patient by Dr. Edwina Barth for evaluation of superficial thrombophlebitis of the left lower extremity.  The patient has a previous history of DVT.  This episode developed a couple of weeks ago.  He had an ultrasound from last Monday which I have reviewed which shows.  No evidence of DVT in the left lower extremity.  There is a superficial thrombophlebitis of the left great saphenous vein and branches of the saphenous vein in the popliteal fossa and calf.  He did 7 days of Lovenox and now the area in question is markedly improved.  He does still have the varicosity back there, but is no longer tender, hard, or particularly swollen.  No chest pain or shortness of breath.  No real leg swelling at this point.   Past Medical History:  Diagnosis Date   Abnormal prostate specific antigen 12/01/2014   CA of prostate ([place]) 12/01/2014   followed by urologist    Decreased libido    DVT (deep venous thrombosis) ([facility])    Erectile dysfunction 12/01/2014   High cholesterol    Prostate cancer ([place]) 2015   Dr. Erlene Quan    Past Surgical History:  Procedure Laterality Date   COLONOSCOPY  2014   [facility]   HERNIA REPAIR  [id number]   umilical      Family History  Problem Relation Age of Onset   Stroke Mother    Pancreatic cancer Brother    Kidney failure Father    Prostate cancer Brother      Social History   Tobacco Use   Smoking status: Former Smoker    Quit date: 07/02/1985    Years since quitting: 35.2   Smokeless tobacco: Never Used  Vaping Use   Vaping Use: Never used  Substance Use Topics   Alcohol use: No    Alcohol/week: 0.0 standard drinks   Drug use: No    Allergies  Allergen Reactions   Apixaban Hives,  Itching, Shortness Of Breath and Swelling   Ibuprofen Shortness Of Breath   Sudafed [Pseudoephedrine Hcl] Shortness Of Breath    Shortness of breath   Xarelto [Rivaroxaban] Hives, Itching, Shortness Of Breath and Swelling    Current Outpatient Medications  Medication Sig Dispense Refill   atorvastatin (LIPITOR) 20 MG tablet Take by mouth.     enoxaparin (LOVENOX) 80 MG/0.8ML injection Inject 0.8 mLs (80 mg total) into the skin 2 (two) times daily for 7 days. 11.2 mL 0   rosuvastatin (CRESTOR) 10 MG tablet Take 1 tablet (10 mg total) daily by mouth. (Patient not taking: No sig reported) 90 tablet 0   No current facility-administered medications for this visit.      REVIEW OF SYSTEMS (Negative unless checked)  Constitutional: [] Weight loss  [] Fever  [] Chills Cardiac: [] Chest pain   [] Chest pressure   [] Palpitations   [] Shortness of breath when laying flat   [] Shortness of breath at rest   [] Shortness of breath with exertion. Vascular:  [] Pain in legs with walking   [] Pain in legs at rest   [] Pain in legs when laying flat   [] Claudication   [] Pain in feet when walking  [] Pain in feet at rest  []   Pain in feet when laying flat   [x] History of DVT   [x] Phlebitis   [] Swelling in legs   [] Varicose veins   [] Non-healing ulcers Pulmonary:   [] Uses home oxygen   [] Productive cough   [] Hemoptysis   [] Wheeze  [] COPD   [] Asthma Neurologic:  [] Dizziness  [] Blackouts   [] Seizures   [] History of stroke   [] History of TIA  [] Aphasia   [] Temporary blindness   [] Dysphagia   [] Weakness or numbness in arms   [] Weakness or numbness in legs Musculoskeletal:  [] Arthritis   [] Joint swelling   [] Joint pain   [] Low back pain Hematologic:  [] Easy bruising  [] Easy bleeding   [] Hypercoagulable state   [] Anemic  [] Hepatitis Gastrointestinal:  [] Blood in stool   [] Vomiting blood  [] Gastroesophageal reflux/heartburn   [] Abdominal pain Genitourinary:  [] Chronic kidney disease   [] Difficult urination  [] Frequent  urination  [] Burning with urination   [] Hematuria Skin:  [] Rashes   [] Ulcers   [] Wounds Psychological:  [] History of anxiety   []  History of major depression.    Physical Exam BP (!) 162/89 (BP Location: Right Arm)    Pulse 86    Resp 16    Ht 5\' 8"  (1.727 m)    Wt 190 lb (86.2 kg)    BMI 28.89 kg/m  Gen:  WD/WN, NAD.  Appears younger than stated age Head: [place]/AT, No temporalis wasting.  Ear/Nose/Throat: Hearing grossly intact, nares w/o erythema or drainage, oropharynx w/o Erythema/Exudate Eyes: Conjunctiva clear, sclera non-icteric  Neck: trachea midline.  No JVD.  Pulmonary:  Good air movement, respirations not labored, no use of accessory muscles  Cardiac: RRR, no JVD Vascular:  Vessel Right Left  Radial Palpable Palpable                                   Gastrointestinal:. No masses, surgical incisions, or scars. Musculoskeletal: M/S 5/5 throughout.  Extremities without ischemic changes.  No deformity or atrophy.  Fairly prominent varicosity in the posterior left upper calf and popliteal area that was previously tender and inflamed but now just feels more like a varicose vein.  No significant lower extremity edema. Neurologic: Sensation grossly intact in extremities.  Symmetrical.  Speech is fluent. Motor exam as listed above. Psychiatric: Judgment intact, Mood & affect appropriate for pt's clinical situation. Dermatologic: No rashes or ulcers noted.  No cellulitis or open wounds.    Radiology US Venous Img Lower Unilateral Left  Result Date: 09/07/2020 CLINICAL DATA:  Left leg and posterior calf pain and swelling concern for DVT EXAM: Left LOWER EXTREMITY VENOUS DOPPLER ULTRASOUND TECHNIQUE: Gray-scale sonography with graded compression, as well as color Doppler and duplex ultrasound were performed to evaluate the lower extremity deep venous systems from the level of the common femoral vein and including the common femoral, femoral, profunda femoral, popliteal and calf  veins including the posterior tibial, peroneal and gastrocnemius veins when visible. The superficial great saphenous vein was also interrogated. Spectral Doppler was utilized to evaluate flow at rest and with distal augmentation maneuvers in the common femoral, femoral and popliteal veins. COMPARISON:  Right lower extremity ultrasound August 08, 2018. FINDINGS: Contralateral Common Femoral Vein: Respiratory phasicity is normal and symmetric with the symptomatic side. No evidence of thrombus. Normal compressibility. Common Femoral Vein: No evidence of thrombus. Normal compressibility, respiratory phasicity and response to augmentation. Saphenofemoral Junction: No evidence of thrombus. Normal compressibility and flow on color Doppler imaging. Profunda  Femoral Vein: No evidence of thrombus. Normal compressibility and flow on color Doppler imaging. Femoral Vein: No evidence of thrombus. Normal compressibility, respiratory phasicity and response to augmentation. Popliteal Vein: No evidence of thrombus. Normal compressibility, respiratory phasicity and response to augmentation. Calf Veins: No evidence of thrombus. Normal compressibility and flow on color Doppler imaging. Superficial Great Saphenous Vein: Nonocclusive thrombus with loss of compressibility, which extends into the superficial veins of the popliteal fossa and calf. Venous Reflux:  None. Other Findings:  None. IMPRESSION: No evidence of DVT within the left lower extremity. Findings compatible with superficial thrombophlebitis of the left greater saphenous vein extending into the superficial veins of the popliteal fossa and calf. Electronically Signed   By: Dahlia Bailiff MD   On: 09/07/2020 11:52    Labs Recent Results (from the past 2160 hour(s))  PSA     Status: Abnormal   Collection Time: 08/11/20  4:29 PM  Result Value Ref Range   Prostate Specific Ag, Serum 13.1 (H) 0.0 - 4.0 ng/mL    Comment: Roche ECLIA methodology. According to the American  Urological Association, Serum PSA should decrease and remain at undetectable levels after radical prostatectomy. The AUA defines biochemical recurrence as an initial PSA value 0.2 ng/mL or greater followed by a subsequent confirmatory PSA value 0.2 ng/mL or greater. Values obtained with different assay methods or kits cannot be used interchangeably. Results cannot be interpreted as absolute evidence of the presence or absence of malignant disease.     Assessment/Plan:  Thrombophlebitis leg Patient has superficial thrombophlebitis of a small saphenous vein branch in the posterior calf and popliteal fossa area extending into the saphenous vein but not going into the thigh.  This was treated with full anticoagulation, but at this point, it appears to have clinically resolved and I would be perfectly okay not having any anticoagulation going forward.  Warm compresses and elevation can be used for symptomatic relief if symptoms recur.  I will plan on seeing him back in about a month but will be happy to see him sooner if any problems develop in the interim.  HLD (hyperlipidemia) lipid control important in reducing the progression of atherosclerotic disease. Continue statin therapy       Leotis Pain 09/15/2020, 4:04 PM   This note was created with Dragon medical transcription system.  Any errors from dictation are unintentional.

## 2020-09-15 NOTE — Patient Instructions (Signed)
Thrombophlebitis Thrombophlebitis is a condition in which a blood clot forms in a vein. This can happen in your arms or legs, or in the area between your neck and groin (torso). When this condition happens in a vein that is close to the surface of the body (superficial thrombophlebitis), it is usually not serious.However, when the condition happens in a vein that is deep inside the body (deep vein thrombosis, DVT), it can cause serious problems. What are the causes? This condition may be caused by:  Damage to a vein.  Inflammation of the veins.  A condition that causes blood to clot more easily.  Reduced blood flow through the veins. What increases the risk? The following factors may make you more likely to develop this condition:  Having a condition that makes blood thicker or more likely to clot.  Having an infection.  Having major surgery.  Experiencing a traumatic injury or a broken bone.  Having a catheter in a vein (central line).  Having a condition in which valves in the veins do not work properly, causing blood to collect (pool) in the veins (chronic venous insufficiency).  An inactive (sedentary) lifestyle.  Pregnancy or having recently given birth.  Cancer.  Older age, especially being 33 or older.  Obesity.  Smoking.  Taking medicines that contain estrogen, such as birth control pills.  Having varicose veins.  Using drugs that are injected into the veins (intravenous, IV). What are the signs or symptoms? The main symptoms of this condition are:  Swelling and pain in an arm or leg. If the affected vein is in the leg, you may feel pain while standing or walking.  Warmth or redness in an arm or leg. Other symptoms include:  Low-grade fever.  Muscle aches.  A bulging vein (venous distension). In some cases, there are no symptoms. How is this diagnosed? This condition may be diagnosed based on:  Your symptoms and medical history.  A physical  exam.  Tests, such as: ? Blood tests. ? A test that uses sound waves to make images (ultrasound). How is this treated? Treatment depends on how severe the condition is and which area of the body is affected. Treatment may include:  Applying a warm compress or heating pad to affected areas.  Wearing compression stockings to help prevent blood clots and reduce swelling in your legs.  Raising (elevating) the affected arm or leg above the level of your heart.  Medicines, such as: ? Anti-inflammatory medicines, such as ibuprofen. ? Blood thinners (anticoagulants), such as heparin. ? Antibiotic medicine, if you have an infection.  Removing an IV that may be causing the problem. In rare cases, surgery may be needed to:  Remove a damaged section of a vein.  Place a filter in a large vein to catch blood clots before they reach the lungs. Follow these instructions at home: Medicines  Take over-the-counter and prescription medicines only as told by your health care provider.  If you were prescribed an antibiotic, take it as told by your health care provider. Do not stop using the antibiotic even if you feel better. Managing pain, stiffness, and swelling  If directed, put heat on the affected area as often as told by your health care provider. Use the heat source that your health care provider recommends, such as a moist heat pack or a heating pad. ? Place a towel between your skin and the heat source. ? Leave the heat on for 20-30 minutes. ? Remove the heat if  your skin turns bright red. This is especially important if you are not able to feel pain, heat, or cold. You may have a greater risk of getting burned.  Elevate the affected area above the level of your heart while you are sitting or lying down.   Activity  Return to your normal activities as told by your health care provider. Ask your health care provider what activities are safe for you.  Avoid sitting or lying down for long  periods. If possible, stand up and walk around regularly. If you are taking blood thinners:  Take your medicine exactly as told, at the same time every day.  Avoid activities that could cause injury or bruising, and follow instructions about how to prevent falls.  Wear a medical alert bracelet or carry a card that lists what medicines you take. General instructions  Drink enough fluid to keep your urine pale yellow.  Wear compression stockings as told by your health care provider.  Do not use any products that contain nicotine or tobacco, such as cigarettes and e-cigarettes. If you need help quitting, ask your health care provider.  Keep all follow-up visits as told by your health care provider. This is important. Contact a health care provider if:  You miss a dose of your blood thinner, if applicable.  Your symptoms do not improve.  You have unusual bruising.  You have nausea, vomiting, or diarrhea that lasts for more than one day. Get help right away if:  You have any of these problems: ? New or worse pain, swelling, or redness in an arm or leg. ? Numbness or tingling in an arm or leg. ? Shortness of breath. ? Chest pain. ? Severe pain in your abdomen. ? Fast breathing. ? A fast or irregular heartbeat. ? Blood in your vomit, stool, or urine. ? A severe headache or confusion. ? A cut that does not stop bleeding.  You feel light-headed or dizzy.  You cough up blood.  You have a serious fall or accident, or you hit your head. These symptoms may represent a serious problem that is an emergency. Do not wait to see if the symptoms will go away. Get medical help right away. Call your local emergency services (911 in the [place] Do not drive yourself to the hospital. Summary  Thrombophlebitis is a condition in which a blood clot forms in a vein. This can happen in a vein close to the surface of the body or a vein deep inside the body.  This condition can cause serious  problems when it happens in a vein deep inside the body (deep vein thrombosis, DVT).  The main symptom of this condition is swelling and pain around the affected vein.  Treatment may include warm compresses, anti-inflammatory medicines, or blood thinners. This information is not intended to replace advice given to you by your health care provider. Make sure you discuss any questions you have with your health care provider. Document Revised: 11/04/2019 Document Reviewed: 11/04/2019 Elsevier Patient Education  [place].

## 2020-09-15 NOTE — Assessment & Plan Note (Signed)
lipid control important in reducing the progression of atherosclerotic disease. Continue statin therapy  

## 2020-10-08 ENCOUNTER — Other Ambulatory Visit: Payer: Self-pay | Admitting: Urology

## 2020-10-13 ENCOUNTER — Ambulatory Visit (INDEPENDENT_AMBULATORY_CARE_PROVIDER_SITE_OTHER): Payer: BC Managed Care – PPO | Admitting: Urology

## 2020-10-13 ENCOUNTER — Other Ambulatory Visit: Payer: Self-pay

## 2020-10-13 ENCOUNTER — Ambulatory Visit: Payer: BC Managed Care – PPO | Admitting: Urology

## 2020-10-13 VITALS — BP 175/91 | HR 69

## 2020-10-13 DIAGNOSIS — C61 Malignant neoplasm of prostate: Secondary | ICD-10-CM

## 2020-10-13 LAB — BLADDER SCAN AMB NON-IMAGING: Scan Result: 76

## 2020-10-13 NOTE — Patient Instructions (Signed)
Robot-Assisted Laparoscopic Prostatectomy  Robot-assisted laparoscopic prostatectomy is a minimally invasive procedure to remove the entire prostate, or prostate gland, and the seminal vesicles. The seminal vesicles are near the bladder and the prostate. This procedure is done to treat prostate cancer that has not spread to other parts of the body (has not metastasized). The goals of the procedure are to remove all cancer cells and to prevent prostate cancer from metastasizing. This procedure involves use of a thin, lighted tube with a tiny camera on the end (laparoscope). The laparoscope will allow your surgeon to do the surgery with several small incisions in your abdomen instead of a large incision. Your surgeon will also use robotic arms during the procedure that will be controlled through a computer. During your procedure, the lymph nodes in your pelvis may be removed. Lymph nodes, also called lymph glands, are part of your body's disease-fighting system (immune system). The lymph nodes in the pelvis may be the first place that is affected by the spreading of prostate cancer. If your pelvic lymph nodes are removed, tissue from the nodes will be tested for cancer cells. Tell a health care provider about:  Any allergies you have.  All medicines you are taking, including vitamins, herbs, eye drops, creams, and over-the-counter medicines.  Any problems you or family members have had with anesthetic medicines.  Any blood disorders you have.  Any surgeries you have had.  Any medical conditions you have.  Any prostate infections you have had. What are the risks? Generally, this is a safe procedure. However, problems may occur, including:  Infection.  Bleeding.  Allergic reactions to medicines.  Problems that affect urination or sexual function. These may include: ? Inability to control when you urinate (incontinence). This is usually temporary. ? Narrowing or scarring (stricture) of the  urethra, which is the part that drains urine from your bladder. Stricture may block the flow of urine. ? Inability to get or keep an erection (erectile dysfunction). ? Dry ejaculation. This is when no semen is released during orgasm.  Blockage (obstruction) of the large intestine or small intestine.  Blood clots in the legs.  The formation of a lymphocele. This is a sac (cyst) in the pelvis that is filled with fluid from the lymph nodes.  Damage to nearby structures or organs. What happens before the procedure? Staying hydrated Follow instructions from your health care provider about hydration, which may include:  Up to 2 hours before the procedure - you may continue to drink clear liquids, such as water, clear fruit juice, black coffee, and plain tea.   Eating and drinking restrictions Follow instructions from your health care provider about eating and drinking, which may include:  8 hours before the procedure - stop eating heavy meals or foods, such as meat, fried foods, or fatty foods.  6 hours before the procedure - stop eating light meals or foods, such as toast or cereal.  6 hours before the procedure - stop drinking milk or drinks that contain milk.  2 hours before the procedure - stop drinking clear liquids. Medicines Ask your health care provider about:  Changing or stopping your regular medicines. This is especially important if you are taking diabetes medicines or blood thinners.  Taking medicines such as aspirin and ibuprofen. These medicines can thin your blood. Do not take these medicines unless your health care provider tells you to take them.  Taking over-the-counter medicines, vitamins, herbs, and supplements. General instructions  Do not use any products  that contain nicotine or tobacco for at least 4 weeks before the procedure. These products include cigarettes, e-cigarettes, and chewing tobacco. If you need help quitting, ask your health care provider.  Plan  to have someone take you home from the hospital or clinic.  If you will be going home right after the procedure, plan to have someone with you for 24 hours.  You may have an exam or testing. This may include a CT scan or an MRI.  You may have a blood or urine sample taken.  Ask your health care provider: ? How your surgery site will be marked. ? What steps will be taken to help prevent infection. These may include:  Removing hair at the surgery site.  Washing skin with a germ-killing soap.  Taking antibiotic medicine. What happens during the procedure?  An IV will be inserted into one of your veins.  You will be given one or more of the following: ? A medicine to help you relax (sedative). ? A medicine to make you fall asleep (general anesthetic).  A thin, flexible tube (catheter) will be inserted through your urethra and into your bladder. The catheter will drain urine from your bladder during the procedure and while you heal.  Four or five small incisions will be made in your abdomen.  The laparoscope and other surgical instruments will be put through the incisions. Your surgeon will use the laparoscope and a robotic arm to help control the surgical instruments.  Your urethra will be cut and separated from your bladder, and your prostate and seminal vesicles will be removed.  Your pelvic lymph nodes may also be removed for testing.  Your urethra will be reconnected to your bladder. This will be done so your catheter is still in place inside of your urethra.  A small tube (drain) may be placed in one or more of your incisions to help drain extra fluid from your surgical site.  Your incisions will be closed with stitches (sutures), skin glue, or adhesive strips.  Medicine may be applied to your incisions.  Bandages (dressings) will be placed over your incisions. The procedure may vary among health care providers and hospitals. What happens after the procedure?  Your  blood pressure, heart rate, breathing rate, and blood oxygen level will be monitored until you leave the hospital or clinic.  You may continue to receive fluids and medicines through an IV.  You will have some pain. You may receive medicines for pain.  Your catheter will remain in place to drain urine from your bladder.  You may have fluid coming from a drain in any incision.  You may have to wear compression stockings. These stockings help to prevent blood clots and reduce swelling in your legs.  You will be encouraged to move around as much as you can.  If you were given a sedative during the procedure, it can affect you for several hours. Do not drive or operate machinery until your health care provider says that it is safe. Summary  Robot-assisted laparoscopic prostatectomy is a procedure to remove the prostate and other tissue.  Follow instructions from your health care provider about eating and drinking before your surgery.  You will have a catheter in your bladder after your surgery to drain urine. This information is not intended to replace advice given to you by your health care provider. Make sure you discuss any questions you have with your health care provider. Document Revised: 04/18/2019 Document Reviewed: 04/18/2019 Elsevier Patient  Education  2021 [employer].

## 2020-10-13 NOTE — Progress Notes (Signed)
10/13/2020 7:28 PM   Brandon Diaz 11-24-1955 [id number]  Referring provider: Baxter Hire, MD [place],  [place] [address]  Chief Complaint  Patient presents with  . Prostate Cancer    HPI: 65 year old male who presents today to discuss biopsy results.  Please see previous notes for prostate cancer history.  He has a personal history of low risk prostate cancer previously on active surveillance.    More recently, his PSA was noted to be rising.  He was found to have 2 areas of interest on prostate MRI.  These revealed Gleason 3+4 in both of the cores involving up to 30% of the specimen, 30% of which consistent with Gleason 4 pattern.  He also had a small focus of Gleason 3+4 at the left and right apex as well as the right mid less than 5% of the tissue.  In summary, he had 5 cores bilaterally of his standard 12 core biopsies with prostate cancer as well as both areas of interest.  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    IPSS    Row Name 10/13/20 1600         International Prostate Symptom Score   How often have you had the sensation of not emptying your bladder? Not at All     How often have you had to urinate less than every two hours? About half the time     How often have you found you stopped and started again several times when you urinated? Less than 1 in 5 times     How often have you found it difficult to postpone urination? Not at All     How often have you had a weak urinary stream? Less than 1 in 5 times     How often have you had to strain to start urination? Less than half the time     How many times did you typically get up at night to urinate? None     Total IPSS Score 7           Quality of Life due to urinary symptoms   If you were to spend the rest of your life with your urinary condition just the way it is now how would you feel about that? Pleased             Score:  1-7 Mild 8-19 Moderate 20-35 Severe    PMH: Past Medical History:  Diagnosis Date  . Abnormal prostate specific antigen 12/01/2014  . CA of prostate ([place]) 12/01/2014   followed by urologist   . Decreased libido   . DVT (deep venous thrombosis) ([place])   . Erectile dysfunction 12/01/2014  . High cholesterol   . Prostate cancer [facility]) 2015   Dr. Erlene Quan    Surgical History: Past Surgical History:  Procedure Laterality Date  . COLONOSCOPY  2014   [facility]  . HERNIA REPAIR  0932   umilical     Home Medications:  Allergies as of 10/13/2020      Reactions   Apixaban Hives, Itching, Shortness Of Breath, Swelling   Ibuprofen Shortness Of Breath   Sudafed [pseudoephedrine Hcl] Shortness Of Breath   Shortness of breath   Xarelto [rivaroxaban] Hives, Itching, Shortness Of Breath, Swelling      Medication List       Accurate as of October 13, 2020  7:28 PM. If you have  any questions, ask your nurse or doctor.        STOP taking these medications   enoxaparin 80 MG/0.8ML injection Commonly known as: LOVENOX Stopped by: Hollice Espy, MD   rosuvastatin 10 MG tablet Commonly known as: CRESTOR Stopped by: Hollice Espy, MD     TAKE these medications   atorvastatin 20 MG tablet Commonly known as: LIPITOR Take by mouth.       Allergies:  Allergies  Allergen Reactions  . Apixaban Hives, Itching, Shortness Of Breath and Swelling  . Ibuprofen Shortness Of Breath  . Sudafed [Pseudoephedrine Hcl] Shortness Of Breath    Shortness of breath  . Xarelto [Rivaroxaban] Hives, Itching, Shortness Of Breath and Swelling    Family History: Family History  Problem Relation Age of Onset  . Stroke Mother   . Pancreatic cancer Brother   . Kidney failure Father   . Prostate cancer Brother     Social History:  reports that he quit smoking about 35 years ago. He has never used smokeless tobacco. He reports that he does not drink alcohol and does not use  drugs.   Physical Exam: BP (!) 175/91   Pulse 69   Constitutional:  Alert and oriented, No acute distress. HEENT: [place] AT, moist mucus membranes.  Trachea midline, no masses. Cardiovascular: No clubbing, cyanosis, or edema. Respiratory: Normal respiratory effort, no increased work of breathing.. Skin: No rashes, bruises or suspicious lesions. Neurologic: Grossly intact, no focal deficits, moving all 4 extremities. Psychiatric: Normal mood and affect.   Assessment & Plan:    1. Prostate cancer ([place]) Progression to favorable intermediate risk prostate cancer from long history of low risk prostate cancer on surveillance  MRI without evidence of gross extracapsular extension or pelvic metastatic disease which is reassuring  At this point, would most strongly recommend consideration of intervention.  Prostate cancer volume is slightly higher than desired for continued surveillance.  . We discussed the risks and benefits of these options with regard to their impact on cancer control and also in terms of potential adverse events, complications, and impact on quality of life particularly related to urinary, bowel, and sexual function. The patient was encouraged to ask questions throughout the discussion today and all questions were answered to his stated satisfaction. In addition, the patient was provided with and/or directed to appropriate resources and literature for further education about prostate cancer treatment options.  We discussed surgical therapy for prostate cancer including the different available surgical approaches.  Specifically, we discussed robotic prostatectomy with pelvic lymph node dissection based on his restratification.  We discussed bilateral nerve sparing given his excellent erectile function as well as favorable disease.  We discussed, in detail, the risks and expectations of surgery with regard to cancer control, urinary control, and erectile dysfunction as well as  expected post operative recovery processed. Additional risks of surgery including but not limited to bleeding, infection, hernia formation, nerve damage, fistula formation, bowel/rectal injury, damage to the urinary tract resulting in urinary leakage, urethral stricture, and cardiopulmonary risk such as myocardial infarction, stroke, death, thromboembolism etc. were explained.   At this point, he is not interested in radiation.  He declined radiation oncology consult.  He is most interested in surgery program [facility] discussion today about his history of DVT.  We will plan to try to minimize risk of recurrent DVT by administering intraoperative heparin, postoperative heparin and aspirin postoperatively.  He was encouraged to ambulate.  Schedule robotic prostatectomy bilateral pelvic lymph node dissection (bilateral  nerve sparing )  Plan for pelvic floor PT  - BLADDER SCAN AMB NON-IMAGING   Hollice Espy, MD  [place] [address], [place], [place] [address] [phone]   I spent 40 total minutes on the day of the encounter including pre-visit review of the medical record, face-to-face time with the patient, and post visit ordering of labs/imaging/tests.  Jorde of the time was spent counseling the patient face-to-face.

## 2020-10-16 ENCOUNTER — Telehealth (INDEPENDENT_AMBULATORY_CARE_PROVIDER_SITE_OTHER): Payer: Self-pay | Admitting: Vascular Surgery

## 2020-10-16 NOTE — Telephone Encounter (Signed)
Patient called in stating he feels he doesn't need appointment.  Will call back to resch, entered in telephone notes as patient was indecisive.

## 2020-10-20 ENCOUNTER — Ambulatory Visit (INDEPENDENT_AMBULATORY_CARE_PROVIDER_SITE_OTHER): Payer: BC Managed Care – PPO | Admitting: Vascular Surgery

## 2020-10-21 ENCOUNTER — Other Ambulatory Visit: Payer: Self-pay

## 2020-10-21 ENCOUNTER — Ambulatory Visit: Payer: BC Managed Care – PPO | Attending: Urology | Admitting: Physical Therapy

## 2020-10-21 ENCOUNTER — Encounter (INDEPENDENT_AMBULATORY_CARE_PROVIDER_SITE_OTHER): Payer: Self-pay | Admitting: Vascular Surgery

## 2020-10-21 DIAGNOSIS — M2170 Unequal limb length (acquired), unspecified site: Secondary | ICD-10-CM | POA: Insufficient documentation

## 2020-10-21 DIAGNOSIS — R278 Other lack of coordination: Secondary | ICD-10-CM | POA: Diagnosis present

## 2020-10-21 DIAGNOSIS — M6208 Separation of muscle (nontraumatic), other site: Secondary | ICD-10-CM | POA: Insufficient documentation

## 2020-10-21 NOTE — Patient Instructions (Signed)
  Proper body mechanics with getting out of a chair to decrease strain  on back &pelvic floor   Avoid holding your breath when Getting out of the chair:  Scoot to front part of chair chair Heels behind knees, feet are hip width apart, nose over toes  Inhale like you are smelling roses Exhale to stand    __   Avoid straining pelvic floor, abdominal muscles , spine  Use log rolling technique instead of getting out of bed with your neck or the sit-up     Log rolling into and out of bed   Log rolling into and out of bed If getting out of bed on R side, Bent knees, scoot hips/ shoulder to L  Raise R arm completely overhead, rolling onto armpit  Then lower bent knees to bed to get into complete side lying position  Then drop legs off bed, and push up onto R elbow/forearm, and use L hand to push onto the bed   __  Wear shoe lift R shoe  Remove if it becomes uncomfortable or painful

## 2020-10-22 NOTE — Therapy (Signed)
[place] MAIN [facility] SERVICES [address] [place], [place], [address] Phone: [phone]   Fax:  [phone]  Physical Therapy Evaluation  Patient Details  Name: Brandon Diaz MRN: [id number] Date of Birth: [birth year] Referring Provider (PT): Erlene Quan MD   Encounter Date: 10/21/2020   PT End of Session - 10/21/20 1124    Visit Number 1    Number of Visits 6    Date for PT Re-Evaluation 12/03/20    PT Start Time 1120    PT Stop Time 1203    PT Time Calculation (min) 43 min           Past Medical History:  Diagnosis Date  . Abnormal prostate specific antigen 12/01/2014  . CA of prostate ([place]) 12/01/2014   followed by urologist   . Decreased libido   . DVT (deep venous thrombosis) ([place])   . Erectile dysfunction 12/01/2014  . High cholesterol   . Prostate cancer [facility]) 2015   Dr. Erlene Quan    Past Surgical History:  Procedure Laterality Date  . COLONOSCOPY  2014   [facility]  . HERNIA REPAIR  4332   umilical     There were no vitals filed for this visit.    Subjective Assessment - 10/21/20 1126    Subjective Pt will under prostate surgery on 11/30/20. Pt has not urinary issues currently. Pt has daily bowel movements 1-3 x daily with no straining but has a Hx of hemorrhoids and hemorroid banding surgeries. Denied LBP. Pt performs heavy lifting at work but uses carts. Pt walks 2 miles a day. Pt has performed sit ups and crunches the past. Pt has had hernial repair surgery ( left of navel with stitches and no mesh was applied). Occupation requires standing all day and being on his feet. Daily water intake: 4-6 fl oz ( 16 fl oz).    Patient Stated Goals do what he has to do to not have leakage              [facility] PT Assessment - 10/21/20 1153      Assessment   Medical Diagnosis Prostate Cancer    Referring Provider (PT) Erlene Quan MD      Strength   Overall Strength Comments RLE 4-/5, L 5/5      Palpation   SI assessment  L iliac  higher, shoulders levelled,    Palpation comment supine: R malleoli shorter than L                      Objective measurements completed on examination: See above findings.     Pelvic Floor Special Questions - 10/22/20 1242    Diastasis Recti minor bulging, hernia reapir with stitches at umbilicus            [facility] Adult PT Treatment/Exercise - 10/22/20 1244      Therapeutic Activites    Other Therapeutic Activities explained post op effects on pelvic floor , exam findings on IAP system      Neuro Re-ed    Neuro Re-ed Details  cued for proper body mechanics                               Plan - 10/22/20 1240    Clinical Impression Statement Pt is a  65   yo male who is scheduled for prostatectomy on 11/30/20. Pt was referred to Pelvic Health PT to train  his pelvic floor mm to yield better outcomes with continence post-surgery. Pt 's clinical presentations included signs of poor intraabdominal pressure system which is associated increased risk for urinary incontinence: _diastasis recti, umbilical hernia scar restrictions _dyscoordination of deep core mm _lack of understanding on exercises that places less strain on the abdomen/pelvic floor.  -uneven leg length height  Pt was provided education on etiology of Sx with anatomy, physiology explanation with images along with the benefits of customized pelvic PT Tx based on pt's medical conditions and musculoskeletal deficits.   Following Tx, pt demo'd equal pelvic girdle height with shoe lift in R shoe and had no complaints. Plan to progress towards deep core coordination at next session and address abdominal wall scar restrictions and weakness. Pt benefits from skilled PT to yield better outcomes post prostrate surgery.    Stability/Clinical Decision Making Stable/Uncomplicated    Clinical Decision Making Moderate    Rehab Potential Good    PT Frequency 1x / week    PT Duration Other (comment)   6   PT  Treatment/Interventions Moist Heat;Therapeutic activities;Therapeutic exercise;Functional mobility training;Stair training;Neuromuscular re-education;Patient/family education;Manual techniques;Taping;Spinal Manipulations;Joint Manipulations;Balance training;Gait training;Cryotherapy;Biofeedback;Traction    Consulted and Agree with Plan of Care Patient           Patient will benefit from skilled therapeutic intervention in order to improve the following deficits and impairments:  Decreased coordination,Abnormal gait,Increased muscle spasms,Postural dysfunction,Improper body mechanics,Pain,Difficulty walking,Decreased safety awareness,Decreased activity tolerance,Decreased strength,Decreased endurance  Visit Diagnosis: Unequal leg length  Other lack of coordination  Diastasis recti     Problem List Patient Active Problem List   Diagnosis Date Noted  . 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 Acute deep vein thrombosis (DVT) of distal vein of right lower extremity ([place]) 04/18/2018  . Elevated blood pressure reading 04/18/2018  . Leukopenia 05/26/2017  . 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 Productive cough 03/29/2016  . History of umbilical hernia repair 35/57/3220  . Abnormal prostate specific antigen 12/01/2014  . CA of prostate ([place]) 12/01/2014  . Screening for human immunodeficiency virus 12/01/2014  . Erectile dysfunction 12/01/2014  . HLD (hyperlipidemia) 03/26/2010  . Decreased libido 07/16/2008    Jerl Mina ,PT, DPT, E-RYT  10/22/2020, 12:45 PM  [place] MAIN [facility] SERVICES [address] [place], [place], [address] Phone: [phone]   Fax:  [phone]  Name: Brandon Diaz MRN: [id number] Date of Birth: [birth year]

## 2020-10-27 ENCOUNTER — Ambulatory Visit: Payer: BC Managed Care – PPO | Admitting: Urology

## 2020-10-28 ENCOUNTER — Other Ambulatory Visit: Payer: Self-pay | Admitting: Urology

## 2020-10-28 ENCOUNTER — Other Ambulatory Visit: Payer: BC Managed Care – PPO

## 2020-10-28 DIAGNOSIS — C61 Malignant neoplasm of prostate: Secondary | ICD-10-CM

## 2020-10-29 ENCOUNTER — Ambulatory Visit: Payer: BC Managed Care – PPO | Attending: Urology | Admitting: Physical Therapy

## 2020-10-29 ENCOUNTER — Other Ambulatory Visit: Payer: Self-pay

## 2020-10-29 DIAGNOSIS — M6208 Separation of muscle (nontraumatic), other site: Secondary | ICD-10-CM | POA: Diagnosis present

## 2020-10-29 DIAGNOSIS — R278 Other lack of coordination: Secondary | ICD-10-CM | POA: Insufficient documentation

## 2020-10-29 DIAGNOSIS — M2170 Unequal limb length (acquired), unspecified site: Secondary | ICD-10-CM | POA: Diagnosis present

## 2020-10-29 NOTE — Therapy (Signed)
[place] MAIN [facility] SERVICES [address] [place], [place], [address] Phone: [phone]   Fax:  [phone]  Physical Therapy Treatment  Patient Details  Name: Brandon Diaz MRN: [id number] Date of Birth: Oct 28, 1955 Referring Provider (PT): Erlene Quan MD   Encounter Date: 10/29/2020   PT End of Session - 10/29/20 0916    Visit Number 2    Number of Visits 6    Date for PT Re-Evaluation 12/03/20    PT Start Time 0911    PT Stop Time 1000    PT Time Calculation (min) 49 min           Past Medical History:  Diagnosis Date  . Abnormal prostate specific antigen 12/01/2014  . CA of prostate ([place]) 12/01/2014   followed by urologist   . Decreased libido   . DVT (deep venous thrombosis) ([place])   . Erectile dysfunction 12/01/2014  . High cholesterol   . Prostate cancer [facility]) 2015   Dr. Erlene Quan    Past Surgical History:  Procedure Laterality Date  . COLONOSCOPY  2014   [facility]  . HERNIA REPAIR  1829   umilical     There were no vitals filed for this visit.   Subjective Assessment - 10/29/20 0917    Subjective Pt reported the shoe lift in his R shoe did not cause any issues. Pt only drinks out of his 24 fl oz out of his Yeti and atleast 48 fl water total per day. 12 fl oz OJ , 8 fl oz apple juice,    Patient Stated Goals do what he has to do to not have leakage              [facility] PT Assessment - 10/29/20 0923      Palpation   SI assessment  iliac crest levelled , shoulders levelled, ( shoe lift in R )                         [facility] Adult PT Treatment/Exercise - 10/29/20 1351      Neuro Re-ed    Neuro Re-ed Details  cued for deep core , hip stretches, hip abduction strengthening HEP      Manual Therapy   Manual therapy comments long axis distraction, rotational mob at SIJ to promote mobility                              Plan - 10/29/20 0917    Clinical Impression Statement Pt demo'd equal  shoulder and pelvic height and less abdominal separation  after wearing shoe lift in R shoe  for the past week. Pt demo'd weakness of hip abductor mm and requried cues for strengthening HEP.   Progressed to deep core series which pt required manual Tx first to increased hip mobility and required cues for technique. Pt continues to benefit from skilled PT. Plan to progress to pelvic floor contraction training at next session.   Pt arrived late at 9:11 and thus, session was shorter.    Stability/Clinical Decision Making Stable/Uncomplicated    Rehab Potential Good    PT Frequency 1x / week    PT Duration Other (comment)   6   PT Treatment/Interventions Moist Heat;Therapeutic activities;Therapeutic exercise;Functional mobility training;Stair training;Neuromuscular re-education;Patient/family education;Manual techniques;Taping;Spinal Manipulations;Joint Manipulations;Balance training;Gait training;Cryotherapy;Biofeedback;Traction    Consulted and Agree with Plan of Care Patient  Patient will benefit from skilled therapeutic intervention in order to improve the following deficits and impairments:  Decreased coordination,Abnormal gait,Increased muscle spasms,Postural dysfunction,Improper body mechanics,Pain,Difficulty walking,Decreased safety awareness,Decreased activity tolerance,Decreased strength,Decreased endurance  Visit Diagnosis: Unequal leg length  Other lack of coordination  Diastasis recti     Problem List Patient Active Problem List   Diagnosis Date Noted  . 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 Acute deep vein thrombosis (DVT) of distal vein of right lower extremity ([place]) 04/18/2018  . Elevated blood pressure reading 04/18/2018  . Leukopenia 05/26/2017  . 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 Productive cough 03/29/2016  . History of umbilical hernia repair 37/03/6268  . Abnormal prostate specific antigen 12/01/2014  . CA of prostate ([place]) 12/01/2014  .  Screening for human immunodeficiency virus 12/01/2014  . Erectile dysfunction 12/01/2014  . HLD (hyperlipidemia) 03/26/2010  . Decreased libido 07/16/2008    Jerl Mina ,PT, DPT, E-RYT 10/29/2020, 2:19 PM  [place] MAIN [facility] SERVICES [address] [place], [place], [address] Phone: [phone]   Fax:  [phone]  Name: Brandon Diaz MRN: [id number] Date of Birth: [birth year]

## 2020-10-29 NOTE — Patient Instructions (Addendum)
    __   Increase water intake from 48 fl oz to 64 fl oz per day  OK to stick with juices   __ 2x day   Clam Shell 45 Degrees  Lying with hips and knees bent 45, one pillow between knees and ankles. Heel together, toes apart like ballerina,  Lift knee with exhale while pressing heels together. Be sure pelvis does not roll backward. Do not arch back. Do 30 times, each leg, 2 times per day.     Complimentary stretch: Figure-4  10  breaths    Knee to chest with towel  10 breath   ______   Deep core level 1-2 ( handout)  2 x day

## 2020-11-05 ENCOUNTER — Ambulatory Visit: Payer: BC Managed Care – PPO | Admitting: Physical Therapy

## 2020-11-17 ENCOUNTER — Other Ambulatory Visit: Payer: Self-pay

## 2020-11-17 ENCOUNTER — Other Ambulatory Visit: Payer: Self-pay | Admitting: *Deleted

## 2020-11-17 ENCOUNTER — Other Ambulatory Visit: Payer: BC Managed Care – PPO

## 2020-11-17 DIAGNOSIS — C61 Malignant neoplasm of prostate: Secondary | ICD-10-CM

## 2020-11-19 LAB — URINALYSIS, COMPLETE
Bilirubin, UA: NEGATIVE
Glucose, UA: NEGATIVE
Ketones, UA: NEGATIVE
Leukocytes,UA: NEGATIVE
Nitrite, UA: NEGATIVE
Protein,UA: NEGATIVE
Specific Gravity, UA: >1.03 — ABNORMAL HIGH (ref 1.005–1.030)
Urobilinogen, Ur: 0.2 mg/dL (ref 0.2–1.0)
pH, UA: 5.5 (ref 5.0–7.5)

## 2020-11-19 LAB — MICROSCOPIC EXAMINATION: Bacteria, UA: NONE SEEN

## 2020-11-20 ENCOUNTER — Other Ambulatory Visit: Payer: BC Managed Care – PPO

## 2020-11-20 LAB — CULTURE, URINE COMPREHENSIVE

## 2020-11-24 ENCOUNTER — Other Ambulatory Visit: Payer: Self-pay

## 2020-11-24 ENCOUNTER — Encounter
Admission: RE | Admit: 2020-11-24 | Discharge: 2020-11-24 | Disposition: A | Discharge status: Home / Self Care | Payer: BC Managed Care – PPO | Source: Ambulatory Visit | Attending: Urology | Admitting: Urology

## 2020-11-24 NOTE — Patient Instructions (Addendum)
COVID TEST IS ON November 26, 2020 Thursday  Your procedure is scheduled on: Monday November 30, 2020  Report to the Registration Desk on the 1st floor of the [facility]. To find out your arrival time, please call [phone] between 1PM - 3PM on: Friday November 27, 2020  REMEMBER: Instructions that are not followed completely may result in serious medical risk, up to and including death; or upon the discretion of your surgeon and anesthesiologist your surgery may need to be rescheduled.  Do NOT EAT OR DRINK  after midnight the night before surgery.  No gum chewing, lozengers or hard candies.  TAKE THESE MEDICATIONS THE MORNING OF SURGERY WITH A SIP OF WATER: NONE  One week prior to surgery: Stop Anti-inflammatories (NSAIDS) such as Advil, Aleve, Ibuprofen, Motrin, Naproxen, Naprosyn and  ASPIRIN OR Aspirin based products such as Excedrin, Goodys Powder, BC Powder.  USE TYLENOL IF NEEDED FOR PAIN Stop ANY OVER THE COUNTER supplements until after surgery. You may however, continue to take Tylenol if needed for pain up until the day of surgery.  No Alcohol for 24 hours before or after surgery.  No Smoking including e-cigarettes for 24 hours prior to surgery.  No chewable tobacco products for at least 6 hours prior to surgery.  No nicotine patches on the day of surgery.  Do not use any "recreational" drugs for at least a week prior to your surgery.  Please be advised that the combination of cocaine and anesthesia may have negative outcomes, up to and including death. If you test positive for cocaine, your surgery will be cancelled.  On the morning of surgery brush your teeth with toothpaste and water, you may rinse your mouth with mouthwash if you wish. Do not swallow any toothpaste or mouthwash.  Do not wear jewelry, make-up, hairpins, clips or nail polish.  Do not wear lotions, powders, or perfumes.   Do not shave body from the neck down 48 hours prior to surgery just in case you cut  yourself which could leave a site for infection.  Also, freshly shaved skin may become irritated if using the CHG soap.  Contact lenses, hearing aids and dentures may not be worn into surgery.  Do not bring valuables to the hospital.  [facility] is not responsible for any missing/lost belongings or valuables.   SHOWER THE DAY OF SURGERY   Notify your doctor if there is any change in your medical condition (cold, fever, infection).  Wear comfortable clothing (specific to your surgery type) to the hospital.  Plan for stool softeners for home use; pain medications have a tendency to cause constipation. You can also help prevent constipation by eating foods high in fiber such as fruits and vegetables and drinking plenty of fluids as your diet allows.  After surgery, you can help prevent lung complications by doing breathing exercises.  Take deep breaths and cough every 1-2 hours. Your doctor may order a device called an Incentive Spirometer to help you take deep breaths. When coughing or sneezing, hold a pillow firmly against your incision with both hands. This is called "splinting." Doing this helps protect your incision. It also decreases belly discomfort.  If you are being admitted to the hospital overnight, [place]   If you are being discharged the day of surgery, you will not be allowed to drive home. You will need a responsible adult (18 years or older) to drive you home and stay with you  that night.     Please call the [facility] Dept. at [phone] if you have any questions about these instructions.  Surgery Visitation Policy:  Patients undergoing a surgery or procedure may have one family member or support person with them as long as that person is not COVID-19 positive or experiencing its symptoms.  That person may remain in the waiting area during the procedure.  Inpatient Visitation:    Visiting hours are 7 a.m. to 8  p.m. Inpatients will be allowed two visitors daily. The visitors may change each day during the patient's stay. No visitors under the age of 30. Any visitor under the age of 50 must be accompanied by an adult. The visitor must pass COVID-19 screenings, use hand sanitizer when entering and exiting the patient's room and wear a mask at all times, including in the patient's room. Patients must also wear a mask when staff or their visitor are in the room. Masking is required regardless of vaccination status.  Total Hip/Knee Replacement Preoperative Educational Video  To better prepare for surgery, please view our videos that explain the physical activity and discharge planning required to have the best surgical recovery at [facility].  [email or web address]      Questions? Call [phone] or email [email or web address]

## 2020-11-26 ENCOUNTER — Ambulatory Visit (INDEPENDENT_AMBULATORY_CARE_PROVIDER_SITE_OTHER): Payer: BC Managed Care – PPO | Admitting: Physician Assistant

## 2020-11-26 ENCOUNTER — Other Ambulatory Visit
Admission: RE | Admit: 2020-11-26 | Discharge: 2020-11-26 | Disposition: A | Discharge status: Home / Self Care | Payer: BC Managed Care – PPO | Source: Ambulatory Visit | Attending: Urology | Admitting: Urology

## 2020-11-26 ENCOUNTER — Encounter: Payer: Self-pay | Admitting: Physician Assistant

## 2020-11-26 ENCOUNTER — Other Ambulatory Visit: Payer: BC Managed Care – PPO

## 2020-11-26 ENCOUNTER — Other Ambulatory Visit: Payer: Self-pay

## 2020-11-26 VITALS — BP 152/80 | HR 80 | Ht 68.0 in | Wt 190.0 lb

## 2020-11-26 DIAGNOSIS — C61 Malignant neoplasm of prostate: Secondary | ICD-10-CM

## 2020-11-26 DIAGNOSIS — Z01812 Encounter for preprocedural laboratory examination: Secondary | ICD-10-CM | POA: Diagnosis present

## 2020-11-26 DIAGNOSIS — Z20822 Contact with and (suspected) exposure to covid-19: Secondary | ICD-10-CM | POA: Diagnosis not present

## 2020-11-26 LAB — BASIC METABOLIC PANEL
Anion gap: 10 (ref 5–15)
BUN: 20 mg/dL (ref 8–23)
CO2: 27 mmol/L (ref 22–32)
Calcium: 9 mg/dL (ref 8.9–10.3)
Chloride: 104 mmol/L (ref 98–111)
Creatinine, Ser: 0.85 mg/dL (ref 0.61–1.24)
GFR, Estimated: >60 mL/min (ref >60)
Glucose, Bld: 92 mg/dL (ref 70–99)
Potassium: 3.7 mmol/L (ref 3.5–5.1)
Sodium: 141 mmol/L (ref 135–145)

## 2020-11-26 LAB — CBC
HCT: 45.4 % (ref 39.0–52.0)
Hemoglobin: 15.3 g/dL (ref 13.0–17.0)
MCH: 29.7 pg (ref 26.0–34.0)
MCHC: 33.7 g/dL (ref 30.0–36.0)
MCV: 88 fL (ref 80.0–100.0)
Platelets: 182 10*3/uL (ref 150–400)
RBC: 5.16 MIL/uL (ref 4.22–5.81)
RDW: 12.9 % (ref 11.5–15.5)
WBC: 3.7 10*3/uL — ABNORMAL LOW (ref 4.0–10.5)
nRBC: 0 % (ref 0.0–0.2)

## 2020-11-26 LAB — TYPE AND SCREEN
ABO/RH(D): A POS
Antibody Screen: NEGATIVE

## 2020-11-26 LAB — PROTIME-INR
INR: 1 (ref 0.8–1.2)
Prothrombin Time: 13.2 seconds (ref 11.4–15.2)

## 2020-11-26 NOTE — Progress Notes (Signed)
Patient presented to clinic today in advance of scheduled robotic assisted laparoscopic radical prostatectomy with BPLND, bilateral nerve sparing, with Dr. Erlene Quan in 4 days.  He had questions regarding what to expect pre-, peri-, and postoperatively.  We discussed that he will undergo general anesthesia and that intraoperatively, Dr. Erlene Quan will place approximately 5 ports throughout his abdomen, including through his umbilicus, to access his prostate.  Intraoperatively, his prostate and the surrounding lymph nodes will be removed and an anastomosis will be created between his bladder and urethra.  I explained that he will awaken from surgery with a Foley catheter in place and to be able to visualize his abdominal incisions with dissolvable sutures and overlying surgical adhesive.  I explained that if his pain is well controlled, he is tolerating p.o. intake, and he is ambulating without difficulty, we will plan to discharge him on POD 1.  I explained that typically patients keep their Foley catheter for 1 week, however if there is concern for a possible anastomotic leak, we will defer Foley catheter removal until he obtains a prior cystogram that shows a watertight seal.  I explained that he will be able to resume his home medications after surgery and avoid heavy lifting for approximately 6 weeks postoperatively.  I explained that he will be discharged with pain medication and antispasmodic medication to help with his postoperative recovery.  I explained that if he is pain is well controlled and he does not require narcotic medications, he does not have to take these.  We discussed that with his history of DVT, postoperative ambulation is particularly important.  Patient expressed understanding, all questions answered.  Debroah Loop, PA-C  11/26/20 12:03 PM  I spent 12 minutes on the day of the encounter to include pre-visit record review, face-to-face time with the patient, and  post-visit ordering of tests.

## 2020-11-27 LAB — SARS CORONAVIRUS 2 (TAT 6-24 HRS): SARS Coronavirus 2: NEGATIVE

## 2020-11-30 ENCOUNTER — Encounter: Payer: Self-pay | Admitting: Urology

## 2020-11-30 ENCOUNTER — Encounter
Admission: RE | Disposition: A | Discharge status: Home / Self Care | Payer: Self-pay | Source: Home / Self Care | Attending: Urology

## 2020-11-30 ENCOUNTER — Other Ambulatory Visit: Payer: Self-pay

## 2020-11-30 ENCOUNTER — Ambulatory Visit: Payer: BC Managed Care – PPO | Admitting: Anesthesiology

## 2020-11-30 ENCOUNTER — Observation Stay
Admission: RE | Admit: 2020-11-30 | Discharge: 2020-12-01 | Disposition: A | Discharge status: Home / Self Care | Payer: BC Managed Care – PPO | Attending: Urology | Admitting: Urology

## 2020-11-30 DIAGNOSIS — C61 Malignant neoplasm of prostate: Principal | ICD-10-CM | POA: Diagnosis present

## 2020-11-30 DIAGNOSIS — Z87891 Personal history of nicotine dependence: Secondary | ICD-10-CM | POA: Insufficient documentation

## 2020-11-30 HISTORY — PX: ROBOT ASSISTED LAPAROSCOPIC RADICAL PROSTATECTOMY: SHX5141

## 2020-11-30 HISTORY — PX: PELVIC LYMPH NODE DISSECTION: SHX6543

## 2020-11-30 LAB — ABO/RH: ABO/RH(D): A POS

## 2020-11-30 LAB — CREATININE, SERUM
Creatinine, Ser: 1.01 mg/dL (ref 0.61–1.24)
GFR, Estimated: >60 mL/min (ref >60)

## 2020-11-30 SURGERY — PROSTATECTOMY, RADICAL, ROBOT-ASSISTED, LAPAROSCOPIC
Anesthesia: General

## 2020-11-30 MED ORDER — MORPHINE SULFATE (PF) 2 MG/ML IV SOLN
2.0000 mg | INTRAVENOUS | Status: DC | PRN
Start: 2020-11-30 — End: 2020-12-01
  Administered 2020-11-30: 2 mg via INTRAVENOUS
  Administered 2020-11-30: 4 mg via INTRAVENOUS
  Filled 2020-11-30: qty 1
  Filled 2020-11-30: qty 2

## 2020-11-30 MED ORDER — HEPARIN SODIUM (PORCINE) 5000 UNIT/ML IJ SOLN
INTRAMUSCULAR | Status: DC | PRN
  Administered 2020-11-30: 5000 [IU] via SUBCUTANEOUS

## 2020-11-30 MED ORDER — CEFAZOLIN SODIUM-DEXTROSE 2-4 GM/100ML-% IV SOLN
INTRAVENOUS | Status: AC
  Filled 2020-11-30: qty 100

## 2020-11-30 MED ORDER — CHLORHEXIDINE GLUCONATE CLOTH 2 % EX PADS
6.0000 | MEDICATED_PAD | Freq: Every day | CUTANEOUS | Status: DC
  Administered 2020-12-01: 6 via TOPICAL

## 2020-11-30 MED ORDER — THROMBIN 5000 UNITS EX SOLR
CUTANEOUS | Status: AC
  Filled 2020-11-30: qty 5000

## 2020-11-30 MED ORDER — OXYCODONE-ACETAMINOPHEN 5-325 MG PO TABS: 1.0000 | ORAL_TABLET | ORAL | 0 refills | Status: DC | PRN

## 2020-11-30 MED ORDER — OXYBUTYNIN CHLORIDE 5 MG PO TABS
5.0000 mg | ORAL_TABLET | Freq: Three times a day (TID) | ORAL | Status: DC | PRN
  Administered 2020-11-30: 5 mg via ORAL
  Filled 2020-11-30: qty 1

## 2020-11-30 MED ORDER — ROCURONIUM BROMIDE 100 MG/10ML IV SOLN
INTRAVENOUS | Status: DC | PRN
  Administered 2020-11-30 (×2): 10 mg via INTRAVENOUS
  Administered 2020-11-30: 20 mg via INTRAVENOUS
  Administered 2020-11-30: 50 mg via INTRAVENOUS

## 2020-11-30 MED ORDER — FENTANYL CITRATE (PF) 100 MCG/2ML IJ SOLN
25.0000 ug | INTRAMUSCULAR | Status: AC | PRN
  Administered 2020-11-30 (×6): 25 ug via INTRAVENOUS

## 2020-11-30 MED ORDER — CHLORHEXIDINE GLUCONATE 0.12 % MT SOLN
OROMUCOSAL | Status: AC
  Administered 2020-11-30: 15 mL via OROMUCOSAL
  Filled 2020-11-30: qty 15

## 2020-11-30 MED ORDER — THROMBIN 5000 UNITS EX SOLR
CUTANEOUS | Status: DC | PRN
  Administered 2020-11-30: 5000 [IU] via TOPICAL

## 2020-11-30 MED ORDER — BUPIVACAINE HCL 0.5 % IJ SOLN
INTRAMUSCULAR | Status: DC | PRN
  Administered 2020-11-30: 20 mL

## 2020-11-30 MED ORDER — ROSUVASTATIN CALCIUM 10 MG PO TABS
10.0000 mg | ORAL_TABLET | ORAL | Status: DC
  Administered 2020-11-30: 10 mg via ORAL
  Filled 2020-11-30: qty 1

## 2020-11-30 MED ORDER — FAMOTIDINE 20 MG PO TABS: 20.0000 mg | ORAL_TABLET | Freq: Once | ORAL | Status: AC

## 2020-11-30 MED ORDER — CEFAZOLIN SODIUM-DEXTROSE 2-4 GM/100ML-% IV SOLN
2.0000 g | INTRAVENOUS | Status: AC
  Administered 2020-11-30: 2 g via INTRAVENOUS

## 2020-11-30 MED ORDER — FENTANYL CITRATE (PF) 100 MCG/2ML IJ SOLN
INTRAMUSCULAR | Status: AC
  Administered 2020-11-30: 25 ug via INTRAVENOUS
  Filled 2020-11-30: qty 2

## 2020-11-30 MED ORDER — EPHEDRINE SULFATE 50 MG/ML IJ SOLN
INTRAMUSCULAR | Status: DC | PRN
  Administered 2020-11-30 (×2): 5 mg via INTRAVENOUS
  Administered 2020-11-30: 7.5 mg via INTRAVENOUS

## 2020-11-30 MED ORDER — LACTATED RINGERS IV SOLN: INTRAVENOUS | Status: DC

## 2020-11-30 MED ORDER — HEPARIN SODIUM (PORCINE) 5000 UNIT/ML IJ SOLN
INTRAMUSCULAR | Status: AC
  Filled 2020-11-30: qty 1

## 2020-11-30 MED ORDER — ONDANSETRON HCL 4 MG/2ML IJ SOLN: 4.0000 mg | Freq: Once | INTRAMUSCULAR | Status: DC | PRN

## 2020-11-30 MED ORDER — OXYCODONE-ACETAMINOPHEN 5-325 MG PO TABS
ORAL_TABLET | ORAL | Status: AC
  Filled 2020-11-30: qty 1

## 2020-11-30 MED ORDER — PROPOFOL 10 MG/ML IV BOLUS
INTRAVENOUS | Status: AC
  Filled 2020-11-30: qty 40

## 2020-11-30 MED ORDER — HYDROMORPHONE HCL 1 MG/ML IJ SOLN
INTRAMUSCULAR | Status: DC | PRN
  Administered 2020-11-30: .25 mg via INTRAVENOUS

## 2020-11-30 MED ORDER — CHLORHEXIDINE GLUCONATE 0.12 % MT SOLN: 15.0000 mL | Freq: Once | OROMUCOSAL | Status: AC

## 2020-11-30 MED ORDER — PHENYLEPHRINE HCL (PRESSORS) 10 MG/ML IV SOLN
INTRAVENOUS | Status: DC | PRN
  Administered 2020-11-30: 50 ug via INTRAVENOUS

## 2020-11-30 MED ORDER — HYDROMORPHONE HCL 1 MG/ML IJ SOLN
INTRAMUSCULAR | Status: AC
  Filled 2020-11-30: qty 1

## 2020-11-30 MED ORDER — SUGAMMADEX SODIUM 200 MG/2ML IV SOLN
INTRAVENOUS | Status: DC | PRN
  Administered 2020-11-30: 200 mg via INTRAVENOUS

## 2020-11-30 MED ORDER — LIDOCAINE HCL (CARDIAC) PF 100 MG/5ML IV SOSY
PREFILLED_SYRINGE | INTRAVENOUS | Status: DC | PRN
  Administered 2020-11-30: 100 mg via INTRAVENOUS

## 2020-11-30 MED ORDER — KETAMINE HCL 50 MG/ML IJ SOLN
INTRAMUSCULAR | Status: DC | PRN
  Administered 2020-11-30: 30 mg via INTRAMUSCULAR

## 2020-11-30 MED ORDER — DOCUSATE SODIUM 100 MG PO CAPS
100.0000 mg | ORAL_CAPSULE | Freq: Two times a day (BID) | ORAL | Status: DC
  Administered 2020-11-30 – 2020-12-01 (×2): 100 mg via ORAL
  Filled 2020-11-30 (×2): qty 1

## 2020-11-30 MED ORDER — ZOLPIDEM TARTRATE 5 MG PO TABS: 5.0000 mg | ORAL_TABLET | Freq: Every evening | ORAL | Status: DC | PRN

## 2020-11-30 MED ORDER — FENTANYL CITRATE (PF) 100 MCG/2ML IJ SOLN
INTRAMUSCULAR | Status: AC
  Filled 2020-11-30: qty 2

## 2020-11-30 MED ORDER — OXYBUTYNIN CHLORIDE ER 10 MG PO TB24
10.0000 mg | ORAL_TABLET | Freq: Every day | ORAL | Status: DC
  Administered 2020-11-30 – 2020-12-01 (×2): 10 mg via ORAL
  Filled 2020-11-30 (×2): qty 1

## 2020-11-30 MED ORDER — BELLADONNA ALKALOIDS-OPIUM 16.2-60 MG RE SUPP: 1.0000 | Freq: Four times a day (QID) | RECTAL | Status: DC | PRN

## 2020-11-30 MED ORDER — MIDAZOLAM HCL 2 MG/2ML IJ SOLN
INTRAMUSCULAR | Status: DC | PRN
  Administered 2020-11-30: 2 mg via INTRAVENOUS

## 2020-11-30 MED ORDER — DIPHENHYDRAMINE HCL 50 MG/ML IJ SOLN: 12.5000 mg | Freq: Four times a day (QID) | INTRAMUSCULAR | Status: DC | PRN

## 2020-11-30 MED ORDER — MIDAZOLAM HCL 2 MG/2ML IJ SOLN
INTRAMUSCULAR | Status: AC
  Filled 2020-11-30: qty 2

## 2020-11-30 MED ORDER — OXYCODONE-ACETAMINOPHEN 5-325 MG PO TABS
1.0000 | ORAL_TABLET | ORAL | Status: DC | PRN
  Administered 2020-11-30 (×2): 2 via ORAL
  Filled 2020-11-30: qty 2

## 2020-11-30 MED ORDER — ONDANSETRON HCL 4 MG/2ML IJ SOLN: 4.0000 mg | INTRAMUSCULAR | Status: DC | PRN

## 2020-11-30 MED ORDER — BUPIVACAINE HCL (PF) 0.5 % IJ SOLN
INTRAMUSCULAR | Status: AC
  Filled 2020-11-30: qty 30

## 2020-11-30 MED ORDER — SODIUM CHLORIDE 0.9 % IV SOLN: INTRAVENOUS | Status: DC

## 2020-11-30 MED ORDER — DEXAMETHASONE SODIUM PHOSPHATE 10 MG/ML IJ SOLN
INTRAMUSCULAR | Status: DC | PRN
  Administered 2020-11-30: 10 mg via INTRAVENOUS

## 2020-11-30 MED ORDER — CEFAZOLIN SODIUM-DEXTROSE 1-4 GM/50ML-% IV SOLN
1.0000 g | Freq: Three times a day (TID) | INTRAVENOUS | Status: AC
  Administered 2020-11-30 (×2): 1 g via INTRAVENOUS
  Filled 2020-11-30 (×2): qty 50

## 2020-11-30 MED ORDER — OXYBUTYNIN CHLORIDE 5 MG PO TABS: 5.0000 mg | ORAL_TABLET | Freq: Three times a day (TID) | ORAL | 0 refills | Status: DC | PRN

## 2020-11-30 MED ORDER — ONDANSETRON HCL 4 MG/2ML IJ SOLN
INTRAMUSCULAR | Status: DC | PRN
  Administered 2020-11-30: 4 mg via INTRAVENOUS

## 2020-11-30 MED ORDER — ORAL CARE MOUTH RINSE: 15.0000 mL | Freq: Once | OROMUCOSAL | Status: AC

## 2020-11-30 MED ORDER — FENTANYL CITRATE (PF) 100 MCG/2ML IJ SOLN
INTRAMUSCULAR | Status: DC | PRN
  Administered 2020-11-30: 100 ug via INTRAVENOUS

## 2020-11-30 MED ORDER — HEPARIN SODIUM (PORCINE) 5000 UNIT/ML IJ SOLN
5000.0000 [IU] | Freq: Three times a day (TID) | INTRAMUSCULAR | Status: DC
  Administered 2020-11-30 – 2020-12-01 (×2): 5000 [IU] via SUBCUTANEOUS
  Filled 2020-11-30 (×2): qty 1

## 2020-11-30 MED ORDER — PROPOFOL 10 MG/ML IV BOLUS
INTRAVENOUS | Status: DC | PRN
  Administered 2020-11-30: 150 mg via INTRAVENOUS

## 2020-11-30 MED ORDER — HEMOSTATIC AGENTS (NO CHARGE) OPTIME
TOPICAL | Status: DC | PRN
  Administered 2020-11-30: 1

## 2020-11-30 MED ORDER — ACETAMINOPHEN 325 MG PO TABS: 650.0000 mg | ORAL_TABLET | ORAL | Status: DC | PRN

## 2020-11-30 MED ORDER — KETAMINE HCL 50 MG/ML IJ SOLN
INTRAMUSCULAR | Status: AC
  Filled 2020-11-30: qty 1

## 2020-11-30 MED ORDER — FAMOTIDINE 20 MG PO TABS
ORAL_TABLET | ORAL | Status: AC
  Administered 2020-11-30: 20 mg via ORAL
  Filled 2020-11-30: qty 1

## 2020-11-30 MED ORDER — DIPHENHYDRAMINE HCL 12.5 MG/5ML PO ELIX
12.5000 mg | ORAL_SOLUTION | Freq: Four times a day (QID) | ORAL | Status: DC | PRN
  Filled 2020-11-30: qty 5

## 2020-11-30 MED ORDER — ASPIRIN EC 325 MG PO TBEC: 325.0000 mg | DELAYED_RELEASE_TABLET | Freq: Every day | ORAL | Status: DC | PRN

## 2020-11-30 SURGICAL SUPPLY — 99 items
ADH SKN CLS APL DERMABOND .7 (GAUZE/BANDAGES/DRESSINGS) ×2
AGENT HMST MTR 8 SURGIFLO (HEMOSTASIS) ×2
ANCHOR TIS RET SYS 235ML (MISCELLANEOUS) ×3 IMPLANT
APL ESCP 34 STRL LF DISP (HEMOSTASIS) ×2
APL PRP STRL LF DISP 70% ISPRP (MISCELLANEOUS) ×4
APPLICATOR SURGIFLO ENDO (HEMOSTASIS) ×3 IMPLANT
APPLIER CLIP LOGIC TI 5 (MISCELLANEOUS) IMPLANT
APR CLP MED LRG 33X5 (MISCELLANEOUS)
BAG DRN RND TRDRP ANRFLXCHMBR (UROLOGICAL SUPPLIES) ×2
BAG TISS RTRVL C235 10X14 (MISCELLANEOUS) ×2
BAG URINE DRAIN 2000ML AR STRL (UROLOGICAL SUPPLIES) ×3 IMPLANT
BLADE CLIPPER SURG (BLADE) ×3 IMPLANT
BULB RESERV EVAC DRAIN JP 100C (MISCELLANEOUS) IMPLANT
CANISTER SUCT 1200ML W/VALVE (MISCELLANEOUS) ×3 IMPLANT
CATH DRAINAGE MALECOT 26FR (CATHETERS) ×2 IMPLANT
CATH FOL 2WAY LX 18X5 (CATHETERS) ×6 IMPLANT
CATH MALECOT (CATHETERS) ×3
CHLORAPREP W/TINT 26 (MISCELLANEOUS) ×6 IMPLANT
CLIP VESOLOCK LG 6/CT PURPLE (CLIP) ×12 IMPLANT
COVER TIP SHEARS 8 DVNC (MISCELLANEOUS) ×2 IMPLANT
COVER TIP SHEARS 8MM DA VINCI (MISCELLANEOUS) ×3
COVER WAND RF STERILE (DRAPES) ×3 IMPLANT
DEFOGGER SCOPE WARMER CLEARIFY (MISCELLANEOUS) ×3 IMPLANT
DERMABOND ADVANCED (GAUZE/BANDAGES/DRESSINGS) ×1
DERMABOND ADVANCED .7 DNX12 (GAUZE/BANDAGES/DRESSINGS) ×2 IMPLANT
DRAIN CHANNEL JP 15F RND 16 (MISCELLANEOUS) IMPLANT
DRAIN CHANNEL JP 19F (MISCELLANEOUS) IMPLANT
DRAPE 3/4 80X56 (DRAPES) ×3 IMPLANT
DRAPE ARM DVNC X/XI (DISPOSABLE) ×8 IMPLANT
DRAPE COLUMN DVNC XI (DISPOSABLE) ×2 IMPLANT
DRAPE DA VINCI XI ARM (DISPOSABLE) ×12
DRAPE DA VINCI XI COLUMN (DISPOSABLE) ×3
DRAPE LEGGINS SURG 28X43 STRL (DRAPES) ×3 IMPLANT
DRAPE SURG 17X11 SM STRL (DRAPES) ×12 IMPLANT
DRAPE UNDER BUTTOCK W/FLU (DRAPES) ×3 IMPLANT
DRSG TELFA 3X8 NADH (GAUZE/BANDAGES/DRESSINGS) ×3 IMPLANT
ELECT REM PT RETURN 9FT ADLT (ELECTROSURGICAL) ×3
ELECTRODE REM PT RTRN 9FT ADLT (ELECTROSURGICAL) ×2 IMPLANT
GLOVE SURG ENC MOIS LTX SZ6.5 (GLOVE) ×6 IMPLANT
GLOVE SURG UNDER POLY LF SZ7.5 (GLOVE) ×3 IMPLANT
GOWN STRL REUS W/ TWL LRG LVL3 (GOWN DISPOSABLE) ×4 IMPLANT
GOWN STRL REUS W/ TWL XL LVL3 (GOWN DISPOSABLE) ×2 IMPLANT
GOWN STRL REUS W/TWL LRG LVL3 (GOWN DISPOSABLE) ×6
GOWN STRL REUS W/TWL XL LVL3 (GOWN DISPOSABLE) ×3
GRASPER SUT TROCAR 14GX15 (MISCELLANEOUS) ×3 IMPLANT
HEMOSTAT SURGICEL 2X14 (HEMOSTASIS) ×1 IMPLANT
HOLDER FOLEY CATH W/STRAP (MISCELLANEOUS) ×3 IMPLANT
IRRIGATION STRYKERFLOW (MISCELLANEOUS) ×2 IMPLANT
IRRIGATOR STRYKERFLOW (MISCELLANEOUS) ×3
IV LACTATED RINGERS 1000ML (IV SOLUTION) ×3 IMPLANT
KIT PINK PAD W/HEAD ARE REST (MISCELLANEOUS) ×3
KIT PINK PAD W/HEAD ARM REST (MISCELLANEOUS) ×2 IMPLANT
LABEL OR SOLS (LABEL) ×3 IMPLANT
MANIFOLD NEPTUNE II (INSTRUMENTS) ×3 IMPLANT
MARKER SKIN DUAL TIP RULER LAB (MISCELLANEOUS) ×3 IMPLANT
NDL INSUFFLATION 14GA 120MM (NEEDLE) ×2 IMPLANT
NEEDLE HYPO 22GX1.5 SAFETY (NEEDLE) ×3 IMPLANT
NEEDLE INSUFFLATION 14GA 120MM (NEEDLE) ×3 IMPLANT
NS IRRIG 500ML POUR BTL (IV SOLUTION) ×3 IMPLANT
OBTURATOR OPTICAL STANDARD 8MM (TROCAR) ×3
OBTURATOR OPTICAL STND 8 DVNC (TROCAR) ×2
OBTURATOR OPTICALSTD 8 DVNC (TROCAR) ×2 IMPLANT
PACK LAP CHOLECYSTECTOMY (MISCELLANEOUS) ×3 IMPLANT
PAD DRESSING TELFA 3X8 NADH (GAUZE/BANDAGES/DRESSINGS) ×2 IMPLANT
PENCIL ELECTRO HAND CTR (MISCELLANEOUS) ×3 IMPLANT
RELOAD STAPLE 60 2.6 WHT THN (STAPLE) IMPLANT
RELOAD STAPLER WHITE 60MM (STAPLE) IMPLANT
SEAL CANN UNIV 5-8 DVNC XI (MISCELLANEOUS) ×8 IMPLANT
SEAL XI 5MM-8MM UNIVERSAL (MISCELLANEOUS) ×12
SET TUBE SMOKE EVAC HIGH FLOW (TUBING) ×3 IMPLANT
SOLUTION ELECTROLUBE (MISCELLANEOUS) ×3 IMPLANT
SPOGE SURGIFLO 8M (HEMOSTASIS) ×3
SPONGE LAP 4X18 RFD (DISPOSABLE) ×3 IMPLANT
SPONGE SURGIFLO 8M (HEMOSTASIS) ×2 IMPLANT
SPONGE VERSALON 4X4 4PLY (MISCELLANEOUS) IMPLANT
STAPLE ECHEON FLEX 60 POW ENDO (STAPLE) ×1 IMPLANT
STAPLER RELOAD WHITE 60MM (STAPLE)
STAPLER SKIN PROX 35W (STAPLE) ×3 IMPLANT
STRAP SAFETY 5IN WIDE (MISCELLANEOUS) ×6 IMPLANT
SURGILUBE 2OZ TUBE FLIPTOP (MISCELLANEOUS) ×3 IMPLANT
SUT DVC VLOC 90 3-0 CV23 UNDY (SUTURE) ×6 IMPLANT
SUT DVC VLOC 90 3-0 CV23 VLT (SUTURE) ×3
SUT ETHILON 3-0 FS-10 30 BLK (SUTURE)
SUT MNCRL 4-0 (SUTURE) ×6
SUT MNCRL 4-0 27XMFL (SUTURE) ×4
SUT SILK 2 0 SH (SUTURE) IMPLANT
SUT VIC AB 0 CT1 36 (SUTURE) ×6 IMPLANT
SUT VIC AB 2-0 SH 27 (SUTURE)
SUT VIC AB 2-0 SH 27XBRD (SUTURE) IMPLANT
SUT VICRYL 0 AB UR-6 (SUTURE) ×3 IMPLANT
SUTURE DVC VLC 90 3-0 CV23 VLT (SUTURE) ×2 IMPLANT
SUTURE EHLN 3-0 FS-10 30 BLK (SUTURE) IMPLANT
SUTURE MNCRL 4-0 27XMF (SUTURE) ×4 IMPLANT
SYR 10ML LL (SYRINGE) ×3 IMPLANT
SYR BULB IRRIG 60ML STRL (SYRINGE) ×3 IMPLANT
SYR TOOMEY 50ML (SYRINGE) ×5 IMPLANT
TAPE CLOTH 3X10 WHT NS LF (GAUZE/BANDAGES/DRESSINGS) ×6 IMPLANT
TROCAR ENDOPATH XCEL 12X100 BL (ENDOMECHANICALS) ×3 IMPLANT
TROCAR XCEL NON-BLD 5MMX100MML (ENDOMECHANICALS) ×3 IMPLANT

## 2020-11-30 NOTE — Op Note (Addendum)
11/30/20  PREOPERATIVE DIAGNOSIS: Prostate cancer.  POSTOPERATIVE DIAGNOSIS: Prostate cancer.  OPERATION PERFORMED: 1. DaVinci laparoscopic radical prostatectomy (bilateral nerve sparing) 2 DaVinci laproscopic bilateral pelvic lymph node dissection.  SURGEON: Hollice Espy, MD  ASSISTANTS: Nickolas Madrid, MD  ANESTHESIA: General.  EBL: 50 cc  SPECIMEN: Prostate with bilateral seminal vesicals, bilateral pelvic lymph nodes, anterior fat pad, posterior bladder neck  FINDINGS: Accessory Pudendal Vessel: none  INDICATION: Pt.is a55 year old male with Gleason 3+4 prostate cancer. Treatment options were discussed with him at length and he chose DaVinci radical prostatectomy.  Excellent baseline erections therefore plan for bilateral nerve sparing was discussed. Bilateral pelvic lymph node dissection was planned due to his risk stratification.  PROCEDURE IN DETAIL: Patient was given appropriate perioperative antibiotics. He had sequential compression devices applied preoperatively for DVT prophylaxis. He was taken to the operating room where he was induced with general anesthesia. After adequate anesthesia, he was placed in the dorsal lithotomy position. His arms were draped by his side and was appropriately padded and secured to the operating room table. He was placed in the Trendelenburg position.  He was prepped and draped in sterile fashion. An 65 French Foley was placed in the bladder and instilled with 15 cc sterile water. Orogastric tube was placed. The Veress needle was passed just above the umbilicus and the abdomen was insufflated to 15 Atmospheres. Using a visiport and 0-0 lens through one of the left lateral ports, An 8 mmblunt-tip trocar was placed.. The additional following trocars were placed under direct vision; 8 mm robotic trocars were placed 9 cm laterally and inferiorly to the initially placed umbilical trocar. A third one was placed 7 cm lateral to  the left-sided trocar, 8 mm trocar just superior to his umbilicus. In the corresponding position on the right side, a 12 mm trocar was placed, and then a 5 mm trocar was placed to the right and well above the umbilicus.  The 12 mm assistant port site was preclosed using 0 Vicryl suture using a Carter-Thomson which was tied down at the end of the case to close this port site.  Notably there as a small amount of skin bleeding at the left lateral port which was controlled with a figure of 8 vicryl suture after bovie was unsuccessful.  Eventually the trochar was replaced at this site.    The robot was then docked with the robot trocar. I used the zero-degree camera. I had the hot scissors in the right hand and the left hand with the [place] bipolar and far left hand the Prograsp forceps. Initially I divided the median umbilical ligament bilaterally and the urachus and developed the space of Retzius down to pubic bone. I divided the parietal peritoneum laterally up to the vas deferens on each side. I used the prograsp forceps to provide cranial traction on the urachus. I cleaned off the Endopelvic fascia on each side and then divided it with the scissors laterally to the perirectal fat and medially to the puboprostatic ligaments which were divided. I then ligated the dorsal vein complex using a 60 mm vascular load stapler.   I then addressed the bladder neck with a 30-degree down lens. I identified the bladder neck by pulling on the Foley catheter. I divided the anterior bladder neck musculature until I then found the anterior bladder neck mucosa which was incised. I identified the Foley catheter within, deflated the balloon, pulled the Foley out through this opening and then using the Carter-Thomason needle with a #0-Vicryl suture,  passed through The suprapubic region and pulled the suture through the eye of the Foley and then back out. This allowed me to provide upward traction on the  prostate. I then divided the lateral bladder neck mucosa and the posterior bladder neck mucosa.  A small button hole was created just right at the bladder neck and the decision was made to excise this small bladder neck portion which was sent for final.  I was well away from ureteral orifices. I divided the posterior bladder neck musculature until I identified the vas deferens. They were freed proximally, then divided. I freed up the seminal vesicals using blunt and sharp dissection. Judicious use of electrocautery was used near the seminal vesicle tips to avoid injury to neurovascular bundle.   I then went back to the 0-degree lens. I divided the Denonvilliers fascia beneath the prostate and developed the prostate off the rectum. I then did a bilateral nerve sparing by  creating a plane which was close to the capsule of the prostate. I then isolated the pedicles of the prostate and placed weck clips on the pedicles of the prostate and then divided it with cold scissors. I continued to divide the neurovascular bundles off the prostate out to the apex of the prostate. At this point the prostate was freed up except for the urethra. I addressed the prostate anteriorly, divided the dorsal vein , then the anterior urethral wall, pulled the Foley catheter back and then divided the posterior urethral wall. Specimen was completely freed up. I placed the prostate in an Endo catch bag and then placed the bag in the upper abdomen out of the way. I then irrigated the pelvis. The rectal test was negative. There was reasonable hemostasis.  I then did the pelvic lymph node dissection by incising the fascia overlying the right external iliac vein, dissecting distally. I went just distal to the node of [place] where we placed clips and then divided the lymphatics. The lateral aspect of the dissection was the pelvic side wall, inferior was the obturator nerve and proximal the hypogastric vessels. I placed  clips at the proximal aspect and then divided the lymphatics. This was removed with the spoon grasper and sent to pathology.   I then did the left obturator lymph node dissection in the same fashion as the left side.  With good hemostasis, I then did the posterior reconstruction. I used a 3-0 VLoc suture through the cut edge of Denonvilliers fascia beneath the bladder on the right side and through the posterior striated sphincter underneath the urethra. This brought the bladder neck and urethra and closer proximity to help facilitate anastomosis.   I then did the urethral vesicle anastomosis again with two 3-0 VLoc sutures interlocked. I passed both ends of the suture from the outside-in through the bladder neck at the 6 o'clock position. I passed both through the urethral stump from the inside-out in the corresponding position. I reapproximated the bladder neck to the urethra. I then ran the Left suture on the left side anastomosis to the 9 o'clock position. Then I went back to the right sided suture and ran that up the right side to the 12 o'clock position. I then continued the left suture to the 12 o'clock position. The suture was then suspended anteriorly behind the pubic bone.   I then placed a new 80 French Foley into the bladder and filled it with 10 cc sterile water. I irrigated the bladder with 160 cc. There was no leakage.  There was reasonable hemostasis.  Surgicel was used on either side of the pedicles for an additional hemostasis.  Surgi-Flo was also used. The instruments were then removed. The robot was undocked and all the trocars were removed under direct vision. There was good hemostasis. I then enlarged the umbilical trocar site large enough to remove the prostate and I closed the fascia here with #0-0 Vicryl suture in a running fashion. All the port sites were irrigated. Lidocaine was injected into all the trocar sites. The skin was closed with 4-0 Monocryl in  running subcuticular fashion. Dermabond was Applied.  He was administered 5000 sq heparin near the end of the procedure.   At this point patient was awakened and extubated in the operating room and taken to the recovery room in stable condition. There were no complications. All counts correct.  Hollice Espy, MD

## 2020-11-30 NOTE — Anesthesia Postprocedure Evaluation (Signed)
Anesthesia Post Note  Patient: Brandon Diaz  Procedure(s) Performed: XI ROBOTIC ASSISTED LAPAROSCOPIC RADICAL PROSTATECTOMY (N/A ) PELVIC LYMPH NODE DISSECTION (Bilateral )  Patient location during evaluation: PACU Anesthesia Type: General Level of consciousness: awake and alert Pain management: pain level controlled Vital Signs Assessment: post-procedure vital signs reviewed and stable Respiratory status: spontaneous breathing and respiratory function stable Cardiovascular status: stable Anesthetic complications: no   No complications documented.   Last Vitals:  Vitals:   11/30/20 1130 11/30/20 1151  BP: 124/71 131/76  Pulse: (!) 55 73  Resp: 18 16  Temp: (!) 36.3 C   SpO2: 97% 97%    Last Pain:  Vitals:   11/30/20 1157  TempSrc:   PainSc: 6                  Jakara Blatter K

## 2020-11-30 NOTE — Progress Notes (Signed)
Order received for PT

## 2020-11-30 NOTE — Progress Notes (Signed)
Patient attempted to get out of bed but was unable to due to the discomfort when moving. Morphine was given before the attempt

## 2020-11-30 NOTE — Transfer of Care (Signed)
Immediate Anesthesia Transfer of Care Note  Patient: Brandon Diaz  Procedure(s) Performed: XI ROBOTIC ASSISTED LAPAROSCOPIC RADICAL PROSTATECTOMY (N/A ) PELVIC LYMPH NODE DISSECTION (Bilateral )  Patient Location: PACU  Anesthesia Type:General  Level of Consciousness: drowsy  Airway & Oxygen Therapy: Patient Spontanous Breathing and Patient connected to nasal cannula oxygen  Post-op Assessment: Report given to RN and Post -op Vital signs reviewed and stable  Post vital signs: Reviewed and stable  Last Vitals:  Vitals Value Taken Time  BP 119/66 11/30/20 1100  Temp 36.2 C 11/30/20 1056  Pulse 73 11/30/20 1100  Resp 25 11/30/20 1100  SpO2 100 % 11/30/20 1100  Vitals shown include unvalidated device data.  Last Pain:  Vitals:   11/30/20 1056  TempSrc:   PainSc: Asleep         Complications: No complications documented.

## 2020-11-30 NOTE — H&P (Signed)
[facility] CT[facility]  Brandon Diaz Apr 25, 1956 [id number]  Referring provider: Baxter Hire, MD [place],   [id number]     Chief Complaint  Patient presents with  . Prostate Cancer    HPI: 65 year old male who presents today to discuss biopsy results.  Please see previous notes for prostate cancer history.  He has a personal history of low risk prostate cancer previously on active surveillance.    More recently, his PSA was noted to be rising.  He was found to have 2 areas of interest on prostate MRI.  These revealed Gleason 3+4 in both of the cores involving up to 30% of the specimen, 30% of which consistent with Gleason 4 pattern.  He also had a small focus of Gleason 3+4 at the left and right apex as well as the right mid less than 5% of the tissue.  In summary, he had 5 cores bilaterally of his standard 12 core biopsies with prostate cancer as well as both areas of interest.  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           IPSS           Row Name 10/13/20 1600               International Prostate Symptom Score    How often have you had the sensation of not emptying your bladder? Not at All      How often have you had to urinate less than every two hours? About half the time      How often have you found you stopped and started again several times when you urinated? Less than 1 in 5 times      How often have you found it difficult to postpone urination? Not at All      How often have you had a weak urinary stream? Less than 1 in 5 times      How often have you had to strain to start urination? Less than half the time      How many times did you typically get up at night to urinate? None      Total IPSS Score 7                    Quality of Life due to urinary symptoms    If you were to spend the rest of your life with your  urinary condition just the way it is now how would you feel about that? Pleased             Score:  1-7 Mild 8-19 Moderate 20-35 Severe    PMH:     Past Medical History:  Diagnosis Date  . Abnormal prostate specific antigen 12/01/2014  . CA of prostate ([place]) 12/01/2014   followed by urologist   . Decreased libido   . DVT (deep venous thrombosis) ([place])   . Erectile dysfunction 12/01/2014  . High cholesterol   . Prostate cancer [facility]) 2015   Dr. Erlene Quan    Surgical History:      Past Surgical History:  Procedure Laterality Date  . COLONOSCOPY  2014   [facility]  . HERNIA REPAIR  [id number]   umilical     Home Medications:       Allergies as of 10/13/2020      Reactions   Apixaban Hives, Itching, Shortness Of Breath, Swelling  Ibuprofen Shortness Of Breath   Sudafed [pseudoephedrine Hcl] Shortness Of Breath   Shortness of breath   Xarelto [rivaroxaban] Hives, Itching, Shortness Of Breath, Swelling         Medication List       Accurate as of October 13, 2020  7:28 PM. If you have any questions, ask your nurse or doctor.        STOP taking these medications   enoxaparin 80 MG/0.8ML injection Commonly known as: LOVENOX Stopped by: Hollice Espy, MD   rosuvastatin 10 MG tablet Commonly known as: CRESTOR Stopped by: Hollice Espy, MD     TAKE these medications   atorvastatin 20 MG tablet Commonly known as: LIPITOR Take by mouth.       Allergies:  Allergies  Allergen Reactions  . Apixaban Hives, Itching, Shortness Of Breath and Swelling  . Ibuprofen Shortness Of Breath  . Sudafed [Pseudoephedrine Hcl] Shortness Of Breath    Shortness of breath  . Xarelto [Rivaroxaban] Hives, Itching, Shortness Of Breath and Swelling    Family History:      Family History  Problem Relation Age of Onset  . Stroke Mother   . Pancreatic cancer Brother   . Kidney failure Father   . Prostate cancer Brother     Social  History:  reports that he quit smoking about 35 years ago. He has never used smokeless tobacco. He reports that he does not drink alcohol and does not use drugs.   Physical Exam: BP (!) 175/91   Pulse 69   Constitutional:  Alert and oriented, No acute distress. HEENT: [place] AT, moist mucus membranes.  Trachea midline, no masses. Cardiovascular: No clubbing, cyanosis, or edema. Respiratory: Normal respiratory effort, no increased work of breathing.. Skin: No rashes, bruises or suspicious lesions. Neurologic: Grossly intact, no focal deficits, moving all 4 extremities. Psychiatric: Normal mood and affect.   Assessment & Plan:    1. Prostate cancer ([place]) Progression to favorable intermediate risk prostate cancer from long history of low risk prostate cancer on surveillance  MRI without evidence of gross extracapsular extension or pelvic metastatic disease which is reassuring  At this point, would most strongly recommend consideration of intervention.  Prostate cancer volume is slightly higher than desired for continued surveillance.  . We discussed the risks and benefits of these options with regard to their impact on cancer control and also in terms of potential adverse events, complications, and impact on quality of life particularly related to urinary, bowel, and sexual function. The patient was encouraged to ask questions throughout the discussion today and all questions were answered to his stated satisfaction. In addition, the patient was provided with and/or directed to appropriate resources and literature for further education about prostate cancer treatment options.  We discussed surgical therapy for prostate cancer including the different available surgical approaches.  Specifically, we discussed robotic prostatectomy with pelvic lymph node dissection based on his restratification.  We discussed bilateral nerve sparing given his excellent erectile function as well as favorable  disease.  We discussed, in detail, the risks and expectations of surgery with regard to cancer control, urinary control, and erectile dysfunction as well as expected post operative recovery processed. Additional risks of surgery including but not limited to bleeding, infection, hernia formation, nerve damage, fistula formation, bowel/rectal injury, damage to the urinary tract resulting in urinary leakage, urethral stricture, and cardiopulmonary risk such as myocardial infarction, stroke, death, thromboembolism etc. were explained.   At this point, he is not interested in  radiation.  He declined radiation oncology consult.  He is most interested in surgery program [facility] [facility] discussion today about his history of DVT.  We will plan to try to minimize risk of recurrent DVT by administering intraoperative heparin, postoperative heparin and aspirin postoperatively.  He was encouraged to ambulate.  Schedule robotic prostatectomy bilateral pelvic lymph node dissection (bilateral nerve sparing )  Plan for pelvic floor PT  - BLADDER SCAN AMB NON-IMAGING   Hollice Espy, MD  [facility] [address], [place], [place] [address] [phone]   I spent 40 total minutes on the day of the encounter including pre-visit review of the medical record, face-to-face time with the patient, and post visit ordering of labs/imaging/tests.  Jorde of the time was spent counseling the patient face-to-face.

## 2020-11-30 NOTE — Anesthesia Procedure Notes (Signed)
Procedure Name: Intubation Date/Time: 11/30/2020 8:12 AM Performed by: Posey Pronto, Emilija Bohman, CRNA Pre-anesthesia Checklist: Patient identified, Patient being monitored, Timeout performed, Emergency Drugs available and Suction available Patient Re-evaluated:Patient Re-evaluated prior to induction Oxygen Delivery Method: Circle system utilized Preoxygenation: Pre-oxygenation with 100% oxygen Induction Type: IV induction Ventilation: Mask ventilation without difficulty and Oral airway inserted - appropriate to patient size Laryngoscope Size: 3 and McGraph Grade View: Grade I Tube type: Oral Tube size: 7.5 mm Number of attempts: 1 Airway Equipment and Method: Stylet Placement Confirmation: ETT inserted through vocal cords under direct vision,  positive ETCO2 and breath sounds checked- equal and bilateral Secured at: 23 cm Tube secured with: Tape Dental Injury: Teeth and Oropharynx as per pre-operative assessment

## 2020-11-30 NOTE — Progress Notes (Signed)
   11/30/20 0730  Clinical Encounter Type  Visited With Patient  Visit Type Initial;Spiritual support;Social support  Referral From Chaplain  Consult/Referral To [facility] had an initial visit with Mr. Mcnulty, right before he had a procedure done. Chaplain made space for the PT to express his emotions. PT spoke of his deep faith in God, and that he believed in the power of prayer. Chaplain ministered with presence and active listening. Chaplain offered the PT well wishes, to which the PT appreciated. PT said he was very pleased with the staff here at [facility].

## 2020-11-30 NOTE — Anesthesia Preprocedure Evaluation (Signed)
Anesthesia Evaluation  Patient identified by MRN, date of birth, ID band Patient awake    Reviewed: Allergy & Precautions, NPO status , Patient's Chart, lab work & pertinent test results  History of Anesthesia Complications Negative for: history of anesthetic complications  Airway Mallampati: II       Dental   Pulmonary neg sleep apnea, neg COPD, Not current smoker, former smoker,           Cardiovascular (-) hypertension(-) Past MI and (-) CHF (-) dysrhythmias (-) Valvular Problems/Murmurs     Neuro/Psych neg Seizures    GI/Hepatic Neg liver ROS, neg GERD  ,  Endo/Other  neg diabetes  Renal/GU negative Renal ROS     Musculoskeletal   Abdominal   Peds  Hematology   Anesthesia Other Findings   Reproductive/Obstetrics                             Anesthesia Physical Anesthesia Plan  ASA: II  Anesthesia Plan: General   Post-op Pain Management:    Induction: Intravenous  PONV Risk Score and Plan: 2  Airway Management Planned: Oral ETT  Additional Equipment:   Intra-op Plan:   Post-operative Plan:   Informed Consent: I have reviewed the patients History and Physical, chart, labs and discussed the procedure including the risks, benefits and alternatives for the proposed anesthesia with the patient or authorized representative who has indicated his/her understanding and acceptance.       Plan Discussed with:   Anesthesia Plan Comments:         Anesthesia Quick Evaluation

## 2020-11-30 NOTE — Discharge Instructions (Signed)
Activity:  You are encouraged to ambulate frequently (about every hour during waking hours) to help prevent blood clots from forming in your legs or lungs.  However, you should not engage in any heavy lifting (> 5-10 lbs), strenuous activity, or straining.   Diet: You should advance your diet as instructed by your physician.  It will be normal to have some bloating, nausea, and abdominal discomfort intermittently.   Prescriptions:  You will be provided a prescription for pain medication to take as needed.  If your pain is not severe enough to require the prescription pain medication, you may take extra strength Tylenol instead which will have less side effects.  You should also take a prescribed stool softener to avoid straining with bowel movements as the prescription pain medication may constipate you.   Incisions: You may remove your dressing bandages 48 hours after surgery if not removed in the hospital.  You will either have some small staples or special tissue glue at each of the incision sites. Once the bandages are removed (if present), the incisions may stay open to air.  You may start showering (but not soaking or bathing in water) the 2nd day after surgery and the incisions simply need to be patted dry after the shower.  No additional care is needed.  What to call us about: You should call the office if you develop fever > 101 or develop persistent vomiting, redness or draining around your incision, or any other concerning symptoms.    [facility] [address] [place], [place] [address] [phone]   Indwelling Urinary Catheter Care, Adult An indwelling urinary catheter is a thin tube that is put into your bladder. The tube helps to drain pee (urine) out of your body. The tube goes in through your urethra. Your urethra is where pee comes out of your body. Your pee will come out through the catheter, then it will go into a bag (drainage  bag). Take good care of your catheter so it will work well. How to wear your catheter and bag Supplies needed  Sticky tape (adhesive tape) or a leg strap.  Alcohol wipe or soap and water (if you use tape).  A clean towel (if you use tape).  Large overnight bag.  Smaller bag (leg bag). Wearing your catheter Attach your catheter to your leg with tape or a leg strap.  Make sure the catheter is not pulled tight.  If a leg strap gets wet, take it off and put on a dry strap.  If you use tape to hold the bag on your leg: 1. Use an alcohol wipe or soap and water to wash your skin where the tape made it sticky before. 2. Use a clean towel to pat-dry that skin. 3. Use new tape to make the bag stay on your leg. Wearing your bags You should have been given a large overnight bag.  You may wear the overnight bag in the day or night.  Always have the overnight bag lower than your bladder.  Do not let the bag touch the floor.  Before you go to sleep, put a clean plastic bag in a wastebasket. Then hang the overnight bag inside the wastebasket. You should also have a smaller leg bag that fits under your clothes.  Always wear the leg bag below your knee.  Do not wear your leg bag at night. How to care for your skin and catheter Supplies needed  A clean washcloth.  Water and mild soap.  A clean towel. Caring for your skin and catheter  Clean the skin around your catheter every day: 1. Wash your hands with soap and water. 2. Wet a clean washcloth in warm water and mild soap. 3. Clean the skin around your urethra.  If you are male:  Gently spread the folds of skin around your vagina (labia).  With the washcloth in your other hand, wipe the inner side of your labia on each side. Wipe from front to back.  If you are male:  Pull back any skin that covers the end of your penis (foreskin).  With the washcloth in your other hand, wipe your penis in small circles. Start wiping at  the tip of your penis, then move away from the catheter.  Move the foreskin back in place, if needed. 4. With your free hand, hold the catheter close to where it goes into your body.  Keep holding the catheter during cleaning so it does not get pulled out. 5. With the washcloth in your other hand, clean the catheter.  Only wipe downward on the catheter.  Do not wipe upward toward your body. Doing this may push germs into your urethra and cause infection. 6. Use a clean towel to pat-dry the catheter and the skin around it. Make sure to wipe off all soap. 7. Wash your hands with soap and water.  Shower every day. Do not take baths.  Do not use cream, ointment, or lotion on the area where the catheter goes into your body, unless your doctor tells you to.  Do not use powders, sprays, or lotions on your genital area.  Check your skin around the catheter every day for signs of infection. Check for: ? Redness, swelling, or pain. ? Fluid or blood. ? Warmth. ? Pus or a bad smell.      How to empty the bag Supplies needed  Rubbing alcohol.  Gauze pad or cotton ball.  Tape or a leg strap. Emptying the bag Pour the pee out of your bag when it is ?- full, or at least 2-3 times a day. Do this for your overnight bag and your leg bag. 1. Wash your hands with soap and water. 2. Separate (detach) the bag from your leg. 3. Hold the bag over the toilet or a clean pail. Keep the bag lower than your hips and bladder. This is so the pee (urine) does not go back into the tube. 4. Open the pour spout. It is at the bottom of the bag. 5. Empty the pee into the toilet or pail. Do not let the pour spout touch any surface. 6. Put rubbing alcohol on a gauze pad or cotton ball. 7. Use the gauze pad or cotton ball to clean the pour spout. 8. Close the pour spout. 9. Attach the bag to your leg with tape or a leg strap. 10. Wash your hands with soap and water. Follow instructions for cleaning the  drainage bag:  From the product maker.  As told by your doctor. How to change the bag Supplies needed  Alcohol wipes.  A clean bag.  Tape or a leg strap. Changing the bag Replace your bag when it starts to leak, smell bad, or look dirty. 1. Wash your hands with soap and water. 2. Separate the dirty bag from your leg. 3. Pinch the catheter with your fingers so that pee does not spill out. 4. Separate the catheter tube from the bag tube where  these tubes connect (at the connection valve). Do not let the tubes touch any surface. 5. Clean the end of the catheter tube with an alcohol wipe. Use a different alcohol wipe to clean the end of the bag tube. 6. Connect the catheter tube to the tube of the clean bag. 7. Attach the clean bag to your leg with tape or a leg strap. Do not make the bag tight on your leg. 8. Wash your hands with soap and water. General rules  Never pull on your catheter. Never try to take it out. Doing that can hurt you.  Always wash your hands before and after you touch your catheter or bag. Use a mild, fragrance-free soap. If you do not have soap and water, use hand sanitizer.  Always make sure there are no twists or bends (kinks) in the catheter tube.  Always make sure there are no leaks in the catheter or bag.  Drink enough fluid to keep your pee pale yellow.  Do not take baths, swim, or use a hot tub.  If you are male, wipe from front to back after you poop (have a bowel movement).   Contact a doctor if:  Your pee is cloudy.  Your pee smells worse than usual.  Your catheter gets clogged.  Your catheter leaks.  Your bladder feels full. Get help right away if:  You have redness, swelling, or pain where the catheter goes into your body.  You have fluid, blood, pus, or a bad smell coming from the area where the catheter goes into your body.  Your skin feels warm where the catheter goes into your body.  You have a fever.  You have pain in  your: ? Belly (abdomen). ? Legs. ? Lower back. ? Bladder.  You see blood in the catheter.  Your pee is pink or red.  You feel sick to your stomach (nauseous).  You throw up (vomit).  You have chills.  Your pee is not draining into the bag.  Your catheter gets pulled out. Summary  An indwelling urinary catheter is a thin tube that is placed into the bladder to help drain pee (urine) out of the body.  The catheter is placed into the part of the body that drains pee from the bladder (urethra).  Taking good care of your catheter will keep it working properly and help prevent problems.  Always wash your hands before and after touching your catheter or bag.  Never pull on your catheter or try to take it out. This information is not intended to replace advice given to you by your health care provider. Make sure you discuss any questions you have with your health care provider. Document Revised: 10/05/2018 Document Reviewed: 01/27/2017 Elsevier Patient Education  [employer].

## 2020-12-01 DIAGNOSIS — C61 Malignant neoplasm of prostate: Secondary | ICD-10-CM | POA: Diagnosis not present

## 2020-12-01 LAB — CBC
HCT: 37.7 % — ABNORMAL LOW (ref 39.0–52.0)
Hemoglobin: 12.6 g/dL — ABNORMAL LOW (ref 13.0–17.0)
MCH: 29.7 pg (ref 26.0–34.0)
MCHC: 33.4 g/dL (ref 30.0–36.0)
MCV: 88.9 fL (ref 80.0–100.0)
Platelets: 147 10*3/uL — ABNORMAL LOW (ref 150–400)
RBC: 4.24 MIL/uL (ref 4.22–5.81)
RDW: 13 % (ref 11.5–15.5)
WBC: 5.8 10*3/uL (ref 4.0–10.5)
nRBC: 0 % (ref 0.0–0.2)

## 2020-12-01 LAB — BASIC METABOLIC PANEL
Anion gap: 5 (ref 5–15)
BUN: 11 mg/dL (ref 8–23)
CO2: 27 mmol/L (ref 22–32)
Calcium: 8.1 mg/dL — ABNORMAL LOW (ref 8.9–10.3)
Chloride: 104 mmol/L (ref 98–111)
Creatinine, Ser: 1.1 mg/dL (ref 0.61–1.24)
GFR, Estimated: >60 mL/min (ref >60)
Glucose, Bld: 109 mg/dL — ABNORMAL HIGH (ref 70–99)
Potassium: 4.4 mmol/L (ref 3.5–5.1)
Sodium: 136 mmol/L (ref 135–145)

## 2020-12-01 MED ORDER — DOCUSATE SODIUM 100 MG PO CAPS: 100.0000 mg | ORAL_CAPSULE | Freq: Two times a day (BID) | ORAL | 0 refills | Status: DC

## 2020-12-01 NOTE — Discharge Summary (Addendum)
Date of admission: 11/30/2020  Date of discharge: 12/01/2020  Admission diagnosis: Prostate cancer  Discharge diagnosis: Same as above  Secondary diagnoses:  Patient Active Problem List   Diagnosis Date Noted   Prostate cancer ([facility]) 11/30/2020   Thrombophlebitis leg 09/10/2020   Acute deep vein thrombosis (DVT) of distal vein of right lower extremity ([place]) 04/18/2018   Elevated blood pressure reading 04/18/2018   Leukopenia 05/26/2017   Neuropathy of right upper extremity 05/03/2017   Productive cough 03/29/2016   History of umbilical hernia repair 85/07/7739   Abnormal prostate specific antigen 12/01/2014   CA of prostate ([place]) 12/01/2014   Screening for human immunodeficiency virus 12/01/2014   Erectile dysfunction 12/01/2014   HLD (hyperlipidemia) 03/26/2010   Decreased libido 07/16/2008    History and Physical: For full details, please see admission history and physical. Briefly, Brandon Diaz is a 65 y.o. year old patient admitted on 11/30/2020 for scheduled robotic assisted laparoscopic radical prostatectomy, bilateral nerve sparing, with bilateral pelvic lymph node dissection with Dr. Erlene Quan.   He was assessed at the bedside on POD 1.  He reports some mild abdominal soreness, R>L, but otherwise feels well.  He has been ambulating in his room, has passed flatus, but has not yet had a BM.  Foley catheter in place draining clear, yellow urine.  Physical Exam: Constitutional:  Alert and oriented, no acute distress, nontoxic appearing HEENT: [place], AT Cardiovascular: No clubbing, cyanosis, or edema Respiratory: Normal respiratory effort, no increased work of breathing GI: Surgical incisions noted over the anterior abdomen, all clean, dry, and intact with overlying surgical adhesive.  There is some pallor over the left lateral port site consistent with Bovie use intraoperatively.  No guarding or rebound. Skin: No rashes, bruises or suspicious lesions Neurologic: Grossly intact, no  focal deficits, moving all 4 extremities Psychiatric: Normal mood and affect   Hospital Course: Patient tolerated the procedure well.  He was then transferred to the floor after an uneventful PACU stay.  His hospital course was uncomplicated.  On POD#1 he had met discharge criteria: was eating a regular diet, was up and ambulating independently,  pain was well controlled, catheter was draining well, and was ready for discharge.  Laboratory values:  Recent Labs    12/01/20 0526  WBC 5.8  HGB 12.6*  HCT 37.7*   Recent Labs    11/30/20 1257 12/01/20 0526  NA  --  136  K  --  4.4  CL  --  104  CO2  --  27  GLUCOSE  --  109*  BUN  --  11  CREATININE 1.01 1.10  CALCIUM  --  8.1*   Results for orders placed or performed during the hospital encounter of 11/26/20  SARS CORONAVIRUS 2 (TAT 6-24 HRS) Nasopharyngeal Nasopharyngeal Swab     Status: None   Collection Time: 11/26/20  9:04 AM   Specimen: Nasopharyngeal Swab  Result Value Ref Range Status   SARS Coronavirus 2 NEGATIVE NEGATIVE Final    Comment: (NOTE) SARS-CoV-2 target nucleic acids are NOT DETECTED.  The SARS-CoV-2 RNA is generally detectable in upper and lower respiratory specimens during the acute phase of infection. Negative results do not preclude SARS-CoV-2 infection, do not rule out co-infections with other pathogens, and should not be used as the sole basis for treatment or other patient management decisions. Negative results must be combined with clinical observations, patient history, and epidemiological information. The expected result is Negative.  Fact Sheet for Patients: SugarRoll.be  Fact Sheet for Healthcare Providers: https://www.woods-mathews.com/  This test is not yet approved or cleared by the [place] FDA and  has been authorized for detection and/or diagnosis of SARS-CoV-2 by FDA under an Emergency Use Authorization (EUA). This EUA will remain  in  effect (meaning this test can be used) for the duration of the COVID-19 declaration under Se ction 564(b)(1) of the Act, 21 U.S.C. section 360bbb-3(b)(1), unless the authorization is terminated or revoked sooner.  Performed at [facility], [place] [address], [place], [place] [address]     Disposition: Home  Discharge instruction: The patient was instructed to be ambulatory but told to refrain from heavy lifting, strenuous activity, or driving.  Catheter care instructions were shared with the patient.  Discharge medications:  Allergies as of 12/01/2020       Reactions   Apixaban Hives, Itching, Shortness Of Breath, Swelling   Ibuprofen Shortness Of Breath   Sudafed [pseudoephedrine Hcl] Shortness Of Breath   Shortness of breath   Xarelto [rivaroxaban] Hives, Itching, Shortness Of Breath, Swelling        Medication List     TAKE these medications    aspirin 325 MG EC tablet Take 325 mg by mouth daily as needed for pain.   dextromethorphan 30 MG/5ML liquid Commonly known as: DELSYM Take 60 mg by mouth 2 (two) times daily as needed for cough.   docusate sodium 100 MG capsule Commonly known as: COLACE Take 1 capsule (100 mg total) by mouth 2 (two) times daily.   oxybutynin 5 MG tablet Commonly known as: DITROPAN Take 1 tablet (5 mg total) by mouth every 8 (eight) hours as needed for bladder spasms.   oxyCODONE-acetaminophen 5-325 MG tablet Commonly known as: Percocet Take 1-2 tablets by mouth every 4 (four) hours as needed for moderate pain or severe pain.   rosuvastatin 10 MG tablet Commonly known as: CRESTOR Take 10 mg by mouth 3 (three) times a week.        Followup:   Follow-up Information     Hollice Espy, MD In 1 week.   Specialty: Urology Why: foley removal Contact information: [place] [phone]                 I have seen and examined the patient, labs/ imaging reviewed and  discussed  management with Debroah Loop. I reviewed the PA's note and agree with the documented findings and plan of care.

## 2020-12-01 NOTE — Evaluation (Signed)
Physical Therapy Evaluation Patient Details Name: Brandon Diaz MRN: [id number] DOB: 1955-09-22 Today's Date: 12/01/2020   History of Present Illness  65 y/o male s/p prostatectomy 6/6.  Clinical Impression  Pt with some initial hesitancy from expected post-op pain but functionally did very well and was able to safely ambulate ~600 ft with community appropriate speed, good confidence, stable vitals.  Pt will have 24/7 assist at home and is safe to return home w/o further PT intervention.  Will sign off.     Follow Up Recommendations No PT follow up    Equipment Recommendations   none recommended    Recommendations for Other Services       Precautions / Restrictions Precautions Precautions: None Restrictions Weight Bearing Restrictions: No      Mobility  Bed Mobility Overal bed mobility: Modified Independent             General bed mobility comments: slow/guarded 2/2 pain but easily gets to EOB    Transfers Overall transfer level: Independent Equipment used: None             General transfer comment: minimal hesitancy, but safe and w/o assist  Ambulation/Gait Ambulation/Gait assistance: Supervision Gait Distance (Feet): 600 Feet Assistive device: None       General Gait Details: Pt was able to ambulate confidently with community appropriate speed.  Some minimal initial hesitancy w/o functional issue.  Stairs Stairs: Yes Stairs assistance: Independent Stair Management: One rail Left;Forwards;Alternating pattern Number of Stairs: 5    Wheelchair Mobility    Modified Rankin (Stroke Patients Only)       Balance Overall balance assessment: Independent                                           Pertinent Vitals/Pain Pain Assessment: 0-10 Pain Score: 7  Pain Location: R lower abdomen    Home Living Family/patient expects to be discharged to:: Private residence Living Arrangements: Spouse/significant other;Other  relatives Available Help at Discharge: Family;Available 24 hours/day Type of Home: House Home Access: Stairs to enter Entrance Stairs-Rails: Can reach both Entrance Stairs-Number of Steps: 7 Home Layout: Able to live on main level with bedroom/bathroom        Prior Function Level of Independence: Independent         Comments: Pt able to be active, drives, does all needs w/o AD, etc     Hand Dominance        Extremity/Trunk Assessment   Upper Extremity Assessment Upper Extremity Assessment: Overall WFL for tasks assessed    Lower Extremity Assessment Lower Extremity Assessment:  (minimal guarding 2/2 pain but functional t/o)       Communication   Communication: No difficulties  Cognition Arousal/Alertness: Awake/alert Behavior During Therapy: WFL for tasks assessed/performed Overall Cognitive Status: Within Functional Limits for tasks assessed                                        General Comments      Exercises     Assessment/Plan    PT Assessment Patent does not need any further PT services  PT Problem List         PT Treatment Interventions      PT Goals (Current goals can be found in the Care Plan section)  Acute Rehab PT Goals Patient Stated Goal: go home today PT Goal Formulation: All assessment and education complete, DC therapy    Frequency     Barriers to discharge        Co-evaluation               AM-PAC PT "6 Clicks" Mobility  Outcome Measure Help needed turning from your back to your side while in a flat bed without using bedrails?: None Help needed moving from lying on your back to sitting on the side of a flat bed without using bedrails?: None Help needed moving to and from a bed to a chair (including a wheelchair)?: None Help needed standing up from a chair using your arms (e.g., wheelchair or bedside chair)?: None Help needed to walk in hospital room?: None Help needed climbing 3-5 steps with a  railing? : None 6 Click Score: 24    End of Session Equipment Utilized During Treatment: Gait belt Activity Tolerance: Patient tolerated treatment well Patient left: in chair;with call bell/phone within reach;with family/visitor present Nurse Communication: Mobility status PT Visit Diagnosis: Muscle weakness (generalized) (M62.81);Difficulty in walking, not elsewhere classified (R26.2)    Time: 1572-6203 PT Time Calculation (min) (ACUTE ONLY): 28 min   Charges:   PT Evaluation $PT Eval Low Complexity: 1 Low          Kreg Shropshire, DPT 12/01/2020, 10:42 AM

## 2020-12-02 ENCOUNTER — Telehealth: Payer: Self-pay

## 2020-12-02 NOTE — Telephone Encounter (Signed)
Patient left message on triage line wanting to know if he needs an abx post op. Attempted to return call no answer, left message to call back. NO abx needed

## 2020-12-03 LAB — SURGICAL PATHOLOGY

## 2020-12-03 NOTE — Telephone Encounter (Signed)
Patient called back and notified no abx needed at this time. ABX given on day of surgery

## 2020-12-08 ENCOUNTER — Telehealth: Payer: Self-pay | Admitting: Urology

## 2020-12-08 ENCOUNTER — Ambulatory Visit (INDEPENDENT_AMBULATORY_CARE_PROVIDER_SITE_OTHER): Payer: BC Managed Care – PPO | Admitting: Physician Assistant

## 2020-12-08 ENCOUNTER — Other Ambulatory Visit: Payer: Self-pay

## 2020-12-08 DIAGNOSIS — C61 Malignant neoplasm of prostate: Secondary | ICD-10-CM

## 2020-12-08 MED ORDER — OXYCODONE-ACETAMINOPHEN 5-325 MG PO TABS: 1.0000 | ORAL_TABLET | Freq: Four times a day (QID) | ORAL | 0 refills | Status: DC | PRN

## 2020-12-08 NOTE — Telephone Encounter (Signed)
LMOM notifying patient.

## 2020-12-08 NOTE — Telephone Encounter (Signed)
I have sent in an additional 4 tablets of Percocet to the [facility] on [address]. He may take this for severe pain, but mild to moderate pain should be managed with OTC pain relievers including Tylenol and Advil. If he needs additional refills of narcotics, we will need to see him back in clinic first to evaluate him for causes of persistent severe postoperative pain.

## 2020-12-08 NOTE — Progress Notes (Signed)
Catheter Removal  Patient is present today for a catheter removal.  78ml of water was drained from the balloon. A 18FR foley cath was removed from the bladder no complications were noted . Patient tolerated well.  Performed by: Debroah Loop, PA-C   Follow up/ Additional notes: Return in about 6 weeks (around 01/19/2021) for prostatectomy f/u with PSA prior with Dr. Erlene Quan.

## 2020-12-08 NOTE — Telephone Encounter (Signed)
Pt wants to know if he can get a refill for pains meds sent to [facility] in [place].  He forgot to mention this morning to Sam at appt.  Dr Erlene Quan did surgery 6/6.  `

## 2020-12-28 ENCOUNTER — Other Ambulatory Visit: Payer: BC Managed Care – PPO

## 2021-01-14 ENCOUNTER — Other Ambulatory Visit: Payer: Self-pay

## 2021-01-14 ENCOUNTER — Other Ambulatory Visit: Payer: BC Managed Care – PPO

## 2021-01-14 DIAGNOSIS — C61 Malignant neoplasm of prostate: Secondary | ICD-10-CM

## 2021-01-15 ENCOUNTER — Ambulatory Visit (INDEPENDENT_AMBULATORY_CARE_PROVIDER_SITE_OTHER): Payer: BC Managed Care – PPO | Admitting: Physician Assistant

## 2021-01-15 VITALS — BP 185/75 | HR 81 | Ht 68.0 in | Wt 190.0 lb

## 2021-01-15 DIAGNOSIS — R300 Dysuria: Secondary | ICD-10-CM

## 2021-01-15 LAB — PSA: Prostate Specific Ag, Serum: <0.1 ng/mL (ref 0.0–4.0)

## 2021-01-15 MED ORDER — OXYBUTYNIN CHLORIDE ER 10 MG PO TB24: 10.0000 mg | ORAL_TABLET | Freq: Every day | ORAL | 0 refills | Status: DC

## 2021-01-15 NOTE — Progress Notes (Signed)
01/15/2021 11:04 AM   Brandon Diaz Jan 19, 1956 [id number]  CC: Chief Complaint  Patient presents with   Urinary Tract Infection   HPI: Brandon Diaz is a 65 y.o. male with PMH prostate cancer s/p robotic assisted laparoscopic radical prostatectomy with Dr. Erlene Quan on 11/30/2020 who presents today for evaluation of possible UTI.   Today he reports intermittent burning at the tip of his penis after he finishes urinating.  He also has general questions about returning to work, short-term disability paperwork, and resuming riding lawnmowing.  Postoperative PSA was drawn yesterday and has resulted undetectable.  In-office UA today positive for trace intact blood; urine microscopy with 6-10 WBCs/HPF, 3-10 RBCs/HPF, and granular casts.  PMH: Past Medical History:  Diagnosis Date   Abnormal prostate specific antigen 12/01/2014   CA of prostate ([place]) 12/01/2014   followed by urologist    Decreased libido    DVT (deep venous thrombosis) ([place])    Erectile dysfunction 12/01/2014   High cholesterol    Prostate cancer ([place]) 2015   Dr. Erlene Quan    Surgical History: Past Surgical History:  Procedure Laterality Date   COLONOSCOPY  2014   [facility]   HERNIA REPAIR  0000000   umilical    PELVIC LYMPH NODE DISSECTION Bilateral 11/30/2020   Procedure: PELVIC LYMPH NODE DISSECTION;  Surgeon: Hollice Espy, MD;  Location: [facility] ORS;  Service: Urology;  Laterality: Bilateral;   ROBOT ASSISTED LAPAROSCOPIC RADICAL PROSTATECTOMY N/A 11/30/2020   Procedure: XI ROBOTIC ASSISTED LAPAROSCOPIC RADICAL PROSTATECTOMY;  Surgeon: Hollice Espy, MD;  Location: [facility] ORS;  Service: Urology;  Laterality: N/A;    Home Medications:  Allergies as of 01/15/2021       Reactions   Apixaban Hives, Itching, Shortness Of Breath, Swelling   Ibuprofen Shortness Of Breath   Sudafed [pseudoephedrine Hcl] Shortness Of Breath   Shortness of breath   Xarelto [rivaroxaban] Hives, Itching, Shortness Of Breath, Swelling         Medication List        Accurate as of January 15, 2021 11:04 AM. If you have any questions, ask your nurse or doctor.          aspirin 325 MG EC tablet Take 325 mg by mouth daily as needed for pain.   dextromethorphan 30 MG/5ML liquid Commonly known as: DELSYM Take 60 mg by mouth 2 (two) times daily as needed for cough.   docusate sodium 100 MG capsule Commonly known as: COLACE Take 1 capsule (100 mg total) by mouth 2 (two) times daily.   oxybutynin 5 MG tablet Commonly known as: DITROPAN Take 1 tablet (5 mg total) by mouth every 8 (eight) hours as needed for bladder spasms.   oxyCODONE-acetaminophen 5-325 MG tablet Commonly known as: Percocet Take 1-2 tablets by mouth every 6 (six) hours as needed for severe pain.   rosuvastatin 10 MG tablet Commonly known as: CRESTOR Take 10 mg by mouth 3 (three) times a week.        Allergies:  Allergies  Allergen Reactions   Apixaban Hives, Itching, Shortness Of Breath and Swelling   Ibuprofen Shortness Of Breath   Sudafed [Pseudoephedrine Hcl] Shortness Of Breath    Shortness of breath   Xarelto [Rivaroxaban] Hives, Itching, Shortness Of Breath and Swelling    Family History: Family History  Problem Relation Age of Onset   Stroke Mother    Pancreatic cancer Brother    Kidney failure Father    Prostate cancer Brother     Social  History:   reports that he quit smoking about 35 years ago. He has never used smokeless tobacco. He reports that he does not drink alcohol and does not use drugs.  Physical Exam: BP (!) 185/75   Pulse 81   Ht '5\' 8"'$  (1.727 m)   Wt 190 lb (86.2 kg)   BMI 28.89 kg/m   Constitutional:  Alert and oriented, no acute distress, nontoxic appearing HEENT: [place], AT Cardiovascular: No clubbing, cyanosis, or edema Respiratory: Normal respiratory effort, no increased work of breathing Skin: No rashes, bruises or suspicious lesions Neurologic: Grossly intact, no focal deficits, moving all 4  extremities Psychiatric: Normal mood and affect  Laboratory Data: Results for orders placed or performed in visit on 01/15/21  Mycoplasma / ureaplasma culture   Specimen: Genital   UR  Result Value Ref Range   Ureaplasma urealyticum Comment Negative   Mycoplasma hominis Culture Comment Negative  Microscopic Examination   Urine  Result Value Ref Range   WBC, UA 6-10 (A) 0 - 5 /hpf   RBC 3-10 (A) 0 - 2 /hpf   Epithelial Cells (non renal) None seen 0 - 10 /hpf   Casts Present (A) None seen /lpf   Cast Type Granular casts (A) N/A   Bacteria, UA None seen None seen/Few  Urinalysis, Complete  Result Value Ref Range   Specific Gravity, UA 1.025 1.005 - 1.030   pH, UA 5.0 5.0 - 7.5   Color, UA Yellow Yellow   Appearance Ur Clear Clear   Leukocytes,UA Negative Negative   Protein,UA Negative Negative/Trace   Glucose, UA Negative Negative   Ketones, UA Negative Negative   RBC, UA Trace (A) Negative   Bilirubin, UA Negative Negative   Urobilinogen, Ur 0.2 0.2 - 1.0 mg/dL   Nitrite, UA Negative Negative   Microscopic Examination See below:    Assessment & Plan:   1. Dysuria Burning discomfort at the tip of the penis after completion of voiding more consistent with possible bladder spasms than true dysuria.  He has mild pyuria and microscopic hematuria today, consistent with his postoperative status, but will send for standard and atypical cultures today to rule out underlying infection.  I am starting him on oxybutynin XL 10 mg daily for management of possible bladder spasms in the interim.  We discussed that he should keep his scheduled postoperative follow-up with Dr. Erlene Quan next week, at which point return to work will be discussed further.  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  Postoperative PSA results shared with the patient. - Urinalysis, Complete - CULTURE, URINE  COMPREHENSIVE - oxybutynin (DITROPAN-XL) 10 MG 24 hr tablet; Take 1 tablet (10 mg total) by mouth daily.  Dispense: 30 tablet; Refill: 0 - Mycoplasma / ureaplasma culture   Return for Will call with results.  Debroah Loop, PA-C  [facility] [address] [place], [place] [address] [phone]

## 2021-01-18 LAB — URINALYSIS, COMPLETE
Bilirubin, UA: NEGATIVE
Glucose, UA: NEGATIVE
Ketones, UA: NEGATIVE
Leukocytes,UA: NEGATIVE
Nitrite, UA: NEGATIVE
Protein,UA: NEGATIVE
Specific Gravity, UA: 1.025 (ref 1.005–1.030)
Urobilinogen, Ur: 0.2 mg/dL (ref 0.2–1.0)
pH, UA: 5 (ref 5.0–7.5)

## 2021-01-18 LAB — MICROSCOPIC EXAMINATION
Bacteria, UA: NONE SEEN
Epithelial Cells (non renal): NONE SEEN /hpf (ref 0–10)

## 2021-01-19 ENCOUNTER — Other Ambulatory Visit: Payer: Self-pay

## 2021-01-19 ENCOUNTER — Ambulatory Visit (INDEPENDENT_AMBULATORY_CARE_PROVIDER_SITE_OTHER): Payer: BC Managed Care – PPO | Admitting: Urology

## 2021-01-19 VITALS — BP 165/82 | HR 65 | Ht 68.0 in | Wt 190.0 lb

## 2021-01-19 DIAGNOSIS — N393 Stress incontinence (female) (male): Secondary | ICD-10-CM

## 2021-01-19 DIAGNOSIS — C61 Malignant neoplasm of prostate: Secondary | ICD-10-CM

## 2021-01-19 DIAGNOSIS — N5231 Erectile dysfunction following radical prostatectomy: Secondary | ICD-10-CM

## 2021-01-19 LAB — CULTURE, URINE COMPREHENSIVE

## 2021-01-19 MED ORDER — SILDENAFIL CITRATE 20 MG PO TABS: ORAL_TABLET | ORAL | 6 refills | Status: DC

## 2021-01-22 LAB — MYCOPLASMA / UREAPLASMA CULTURE
Mycoplasma hominis Culture: NEGATIVE
Ureaplasma urealyticum: NEGATIVE

## 2021-01-22 NOTE — Progress Notes (Signed)
01/19/2021 10:19 AM   Brandon Diaz [birth year] [id number]  Referring provider: [place] Hire, MD [place],  [place] [address]  Chief Complaint  Patient presents with   Prostate Cancer    HPI: 65 year old male with a personal history of prostate cancer who returns today for routine postop follow-up.  He underwent radical robotic prostatectomy with bilateral pelvic lymph node dissection on 11/30/2020.   Surgical pathology consistent with Gleason 3+4 pT3a with presence of nonfocal extra prostatic extension, positive margin at left apex, negative lymph nodes, negative seminal vesicle invasion.  PSA checked on 01/14/2021 is undetectable  Today, he is a little sore around his incision sites but otherwise doing well.  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  He was requesting another 2 weeks for recovery which is reasonable.  He had no erections today.  No significant preoperative erectile dysfunction.  Bilateral nerve sparing procedure was performed.  In terms of urinary leakage, he is improving.  Initially, he wore diapers but findings uncomfortable.  He now wears pads but changes them often as he tolerates very little saturation before he feels like he needs to change and for hygiene purposes.  He has not yet resumed doing his pelvic floor exercises.    PMH: Past Medical History:  Diagnosis Date   Abnormal prostate specific antigen 12/01/2014   CA of prostate ([place]) 12/01/2014   followed by urologist    Decreased libido    DVT (deep venous thrombosis) ([place])    Erectile dysfunction 12/01/2014   High cholesterol    Prostate cancer ([place]) 2015   Dr. Erlene Quan    Surgical History: Past Surgical History:  Procedure Laterality Date   COLONOSCOPY  2014   [facility]   HERNIA REPAIR  [id number]   umilical    PELVIC LYMPH NODE DISSECTION Bilateral 11/30/2020   Procedure: PELVIC LYMPH NODE DISSECTION;  Surgeon: Hollice Espy, MD;  Location: [facility] ORS;  Service: Urology;  Laterality: Bilateral;   ROBOT ASSISTED LAPAROSCOPIC RADICAL PROSTATECTOMY N/A 11/30/2020   Procedure: XI ROBOTIC ASSISTED LAPAROSCOPIC RADICAL PROSTATECTOMY;  Surgeon: Hollice Espy, MD;  Location: [facility] ORS;  Service: Urology;  Laterality: N/A;    Home Medications:  Allergies as of 01/19/2021       Reactions   Apixaban Hives, Itching, Shortness Of Breath, Swelling   Ibuprofen Shortness Of Breath   Sudafed [pseudoephedrine Hcl] Shortness Of Breath   Shortness of breath   Xarelto [rivaroxaban] Hives, Itching, Shortness Of Breath, Swelling        Medication List        Accurate as of January 19, 2021 11:59 PM. If you have any questions, ask your nurse or doctor.          STOP taking these medications    docusate sodium 100 MG capsule Commonly known as: COLACE Stopped by: Hollice Espy, MD   oxybutynin 10 MG 24 hr tablet Commonly known as: DITROPAN-XL Stopped by: Hollice Espy, MD   oxyCODONE-acetaminophen 5-325 MG tablet Commonly known as: Percocet Stopped by: Hollice Espy, MD       TAKE these medications    aspirin 325 MG EC tablet Take 325 mg by mouth daily as needed for pain.   dextromethorphan 30 MG/5ML liquid Commonly known as: DELSYM Take 60 mg by mouth 2 (two) times daily as needed for cough.   rosuvastatin 10 MG tablet Commonly known as: CRESTOR Take 10 mg by mouth 3 (three) times  a week.   sildenafil 20 MG tablet Commonly known as: REVATIO 1-5 tablets as needed 1 hour prior to intercourse Started by: Hollice Espy, MD        Allergies:  Allergies  Allergen Reactions   Apixaban Hives, Itching, Shortness Of Breath and Swelling   Ibuprofen Shortness Of Breath   Sudafed [Pseudoephedrine Hcl] Shortness Of Breath    Shortness of breath   Xarelto [Rivaroxaban] Hives, Itching, Shortness Of Breath and Swelling    Family History: Family History  Problem Relation Age of Onset   Stroke  Mother    Pancreatic cancer Brother    Kidney failure Father    Prostate cancer Brother     Social History:  reports that he quit smoking about 35 years ago. He has never used smokeless tobacco. He reports that he does not drink alcohol and does not use drugs.   Physical Exam: BP (!) 165/82   Pulse 65   Ht '5\' 8"'$  (1.727 m)   Wt 190 lb (86.2 kg)   BMI 28.89 kg/m   Constitutional:  Alert and oriented, No acute distress. HEENT: [place] AT, moist mucus membranes.  Trachea midline, no masses. Cardiovascular: No clubbing, cyanosis, or edema. Respiratory: Normal respiratory effort, no increased work of breathing. GI: Abdominal incisions healing well.  No hernias. Skin: No rashes, bruises or suspicious lesions. Neurologic: Grossly intact, no focal deficits, moving all 4 extremities. Psychiatric: Normal mood and affect.   Assessment & Plan:    1. Prostate cancer [facility]) Reviewed surgical pathology  Currently no evidence of disease, PSA remains undetectable which is reassuring  Will continue to follow his PSA on a every 6 month basis for several years and then annually thereafter  We discussed the role of salvage radiation if needed if he does develop biochemical recurrence as well as the definition of this - PSA; Future  2. SUI (stress urinary incontinence), male Patient was reassured, early in the healing process.  Advised to resume pelvic floor exercises to help expedite recovery.  3. Erectile dysfunction after radical prostatectomy We discussed penile rehab today including the early initiation of PDE 5 inhibitors, prescription for sildenafil provided today advised to use this ideally at least twice per week  We also discussed injection therapy, ICI in our office protocol if he elects to pursue this.  Would encourage early erections to help facilitate recovery, he will let us know if he wants to pursue this    Follow-up in 6 months with PSA prior  Hollice Espy, MD  [facility] [address] [place], [place] [address] [phone]

## 2021-02-09 ENCOUNTER — Other Ambulatory Visit: Payer: Self-pay | Admitting: Physician Assistant

## 2021-02-09 DIAGNOSIS — R300 Dysuria: Secondary | ICD-10-CM

## 2021-02-20 ENCOUNTER — Other Ambulatory Visit: Payer: Self-pay | Admitting: Physician Assistant

## 2021-02-20 DIAGNOSIS — R300 Dysuria: Secondary | ICD-10-CM

## 2021-05-01 ENCOUNTER — Ambulatory Visit
Admission: EM | Admit: 2021-05-01 | Discharge: 2021-05-01 | Disposition: A | Discharge status: Home / Self Care | Payer: BC Managed Care – PPO | Attending: Emergency Medicine | Admitting: Emergency Medicine

## 2021-05-01 ENCOUNTER — Encounter: Payer: Self-pay | Admitting: Emergency Medicine

## 2021-05-01 ENCOUNTER — Other Ambulatory Visit: Payer: Self-pay

## 2021-05-01 DIAGNOSIS — R110 Nausea: Secondary | ICD-10-CM | POA: Insufficient documentation

## 2021-05-01 DIAGNOSIS — Z20822 Contact with and (suspected) exposure to covid-19: Secondary | ICD-10-CM | POA: Diagnosis not present

## 2021-05-01 DIAGNOSIS — J069 Acute upper respiratory infection, unspecified: Secondary | ICD-10-CM | POA: Insufficient documentation

## 2021-05-01 LAB — RAPID INFLUENZA A&B ANTIGENS
Influenza A (ARMC): NEGATIVE
Influenza B (ARMC): NEGATIVE

## 2021-05-01 MED ORDER — IPRATROPIUM BROMIDE 0.06 % NA SOLN: 2.0000 | Freq: Four times a day (QID) | NASAL | 12 refills | Status: AC

## 2021-05-01 MED ORDER — BENZONATATE 100 MG PO CAPS: 200.0000 mg | ORAL_CAPSULE | Freq: Three times a day (TID) | ORAL | 0 refills | Status: DC

## 2021-05-01 NOTE — Discharge Instructions (Signed)
Isolate at home pending the results of your COVID test.  If you test positive then you will have to quarantine for 5 days from the start of your symptoms.  After 5 days you can break quarantine if your symptoms have improved and you have not had a fever for 24 hours without taking Tylenol or ibuprofen.  Use over-the-counter Tylenol as needed for body aches and fever.  Use the Atrovent nasal spray, 2 squirts in each nostril every 6 hours, as needed for runny nose and postnasal drip.  Use the Tessalon Perles every 8 hours during the day.  Take them with a small sip of water.  They may give you some numbness to the base of your tongue or a metallic taste in your mouth, this is normal.  Return for reevaluation or see your primary care provider for any new or worsening symptoms.   If you develop any increased shortness of breath-especially at rest, you are unable to speak in full sentences, or is a late sign your lips are turning blue you need to go the ER for evaluation.

## 2021-05-01 NOTE — ED Triage Notes (Signed)
Patient states that yesterday he started feeling sick my nausea and dry heaves.  Patient also reports fever, chills, and bodyaches.  Patient took home covid test yesterday and was negative.

## 2021-05-01 NOTE — ED Provider Notes (Signed)
[facility]    CSN: [id number] Arrival date & time: 05/01/21  0941      History   Chief Complaint Chief Complaint  Patient presents with   Generalized Body Aches   Fever   Cough    HPI Brandon Diaz is a 65 y.o. male.   HPI  64 year old male here for evaluation of respiratory symptoms.  Patient reports that he developed symptoms suddenly yesterday and began feeling poorly with fever T-max of 101, runny nose nasal congestion, chills, nonproductive cough, body aches, nausea and dry heaving.  He is also had a decreased appetite.  He denies any ear pain, sore throat, shortness of breath or wheezing, or diarrhea.  He has had all of his COVID vaccinations and his flu shot.  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  Past Medical History:  Diagnosis Date   Abnormal prostate specific antigen 12/01/2014   CA of prostate ([place]) 12/01/2014   followed by urologist    Decreased libido    DVT (deep venous thrombosis) ([place])    Erectile dysfunction 12/01/2014   High cholesterol    Prostate cancer ([place]) 2015   Dr. 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   Productive cough 03/29/2016   History of umbilical hernia repair 94/17/4081   Abnormal prostate specific antigen 12/01/2014   CA of prostate ([place]) 12/01/2014   Screening for human immunodeficiency virus 12/01/2014   Erectile dysfunction 12/01/2014   HLD (hyperlipidemia) 03/26/2010   Decreased libido 07/16/2008    Past Surgical History:  Procedure Laterality Date   COLONOSCOPY  2014   [facility]   HERNIA REPAIR  [id number]    umilical    PELVIC LYMPH NODE DISSECTION Bilateral 11/30/2020   Procedure: PELVIC LYMPH NODE DISSECTION;  Surgeon: Hollice Espy, MD;  Location: [facility] ORS;  Service: Urology;  Laterality: Bilateral;   ROBOT ASSISTED LAPAROSCOPIC RADICAL PROSTATECTOMY N/A 11/30/2020   Procedure: XI ROBOTIC ASSISTED LAPAROSCOPIC RADICAL PROSTATECTOMY;  Surgeon: Hollice Espy, MD;  Location: [facility] ORS;  Service: Urology;  Laterality: N/A;       Home Medications    Prior to Admission medications   Medication Sig Start Date End Date Taking? Authorizing Provider  aspirin 325 MG EC tablet Take 325 mg by mouth daily as needed for pain.   Yes [provider]  benzonatate (TESSALON) 100 MG capsule Take 2 capsules (200 mg total) by mouth every 8 (eight) hours. 05/01/21  Yes Margarette Canada, NP  ipratropium (ATROVENT) 0.06 % nasal spray Place 2 sprays into both nostrils 4 (four) times daily. 05/01/21  Yes Margarette Canada, NP  rosuvastatin (CRESTOR) 10 MG tablet Take 10 mg by mouth 3 (three) times a week.   Yes [provider]  dextromethorphan (DELSYM) 30 MG/5ML liquid Take 60 mg by mouth 2 (two) times daily as needed for cough.    [provider]  sildenafil (REVATIO) 20 MG tablet 1-5 tablets as needed 1 hour prior to intercourse 01/19/21   Hollice Espy, MD    Family History Family History  Problem Relation Age of Onset  Stroke Mother    Pancreatic cancer Brother    Kidney failure Father    Prostate cancer Brother     Social History Social History   Tobacco Use   Smoking status: Former    Types: Cigarettes    Quit date: 07/02/1985    Years since quitting: 35.8   Smokeless tobacco: Never  Vaping Use   Vaping Use: Never used  Substance Use Topics   Alcohol use: No    Alcohol/week: 0.0 standard drinks   Drug use: No     Allergies   Apixaban, Ibuprofen, Sudafed [pseudoephedrine hcl], and Xarelto [rivaroxaban]   Review of Systems Review of Systems  Constitutional:  Positive  for appetite change, chills and fever. Negative for activity change.  HENT:  Positive for congestion and rhinorrhea. Negative for ear pain and sore throat.   Respiratory:  Positive for cough. Negative for shortness of breath and wheezing.   Gastrointestinal:  Positive for nausea. Negative for diarrhea and vomiting.  Musculoskeletal:  Positive for arthralgias and neck pain.  Skin:  Negative for rash.  Hematological: Negative.   Psychiatric/Behavioral: Negative.      Physical Exam Triage Vital Signs ED Triage Vitals  Enc Vitals Group     BP 05/01/21 0956 131/73     Pulse Rate 05/01/21 0956 91     Resp 05/01/21 0956 16     Temp 05/01/21 0956 100.2 F (37.9 C)     Temp Source 05/01/21 0956 Oral     SpO2 05/01/21 0956 99 %     Weight 05/01/21 0952 188 lb (85.3 kg)     Height 05/01/21 0952 5\' 8"  (1.727 m)     Head Circumference --      Peak Flow --      Pain Score 05/01/21 0952 5     Pain Loc --      Pain Edu? --      Excl. in [place]? --    No data found.  Updated Vital Signs BP 131/73 (BP Location: Right Arm)   Pulse 91   Temp 100.2 F (37.9 C) (Oral)   Resp 16   Ht 5\' 8"  (1.727 m)   Wt 188 lb (85.3 kg)   SpO2 99%   BMI 28.59 kg/m   Visual Acuity Right Eye Distance:   Left Eye Distance:   Bilateral Distance:    Right Eye Near:   Left Eye Near:    Bilateral Near:     Physical Exam Vitals and nursing note reviewed.  Constitutional:      General: He is not in acute distress.    Appearance: Normal appearance. He is not ill-appearing.  HENT:     Head: Normocephalic and atraumatic.     Right Ear: Tympanic membrane, ear canal and external ear normal. There is no impacted cerumen.     Left Ear: Ear canal and external ear normal. There is no impacted cerumen.     Nose: Congestion present. No rhinorrhea.     Mouth/Throat:     Mouth: Mucous membranes are moist.     Pharynx: Oropharynx is clear. Posterior oropharyngeal erythema present.  Cardiovascular:     Rate and  Rhythm: Normal rate and regular rhythm.     Pulses: Normal pulses.     Heart sounds: Normal heart sounds. No murmur heard.   No gallop.  Pulmonary:     Effort: Pulmonary effort is normal.     Breath sounds: Normal breath sounds. No wheezing, rhonchi or rales.  Musculoskeletal:     Cervical back: Normal range of motion and neck supple.  Lymphadenopathy:     Cervical: No cervical adenopathy.  Skin:    General: Skin is warm and dry.     Capillary Refill: Capillary refill takes less than 2 seconds.     Findings: No erythema or rash.  Neurological:     General: No focal deficit present.     Mental Status: He is alert and oriented to person, place, and time.  Psychiatric:        Mood and Affect: Mood normal.        Behavior: Behavior normal.        Thought Content: Thought content normal.        Judgment: Judgment normal.     UC Treatments / Results  Labs (all labs ordered are listed, but only abnormal results are displayed) Labs Reviewed  RAPID INFLUENZA A&B ANTIGENS  SARS CORONAVIRUS 2 (TAT 6-24 HRS)    EKG   Radiology No results found.  Procedures Procedures (including critical care time)  Medications Ordered in UC Medications - No data to display  Initial Impression / Assessment and Plan / UC Course  I have reviewed the triage vital signs and the nursing notes.  Pertinent labs & imaging results that were available during my care of the patient were reviewed by me and considered in my medical decision making (see chart for details).  Patient is a very pleasant, nontoxic-appearing 65 year old male here for evaluation of constitutional and respiratory symptoms that began yesterday.  On physical exam patient has a mildly erythematous left tympanic membrane.  There is no injection or effusion.  The TM is not bulging.  External auditory canal is clear.  Patient denies any pain.  Right TM is pearly gray with a normal light reflex and clear external auditory canal.  There is  mucosa is erythematous and edematous without any significant nasal discharge.  Oropharyngeal exam reveals posterior oropharyngeal erythema and mild clear postnasal drip.  No cervical lymphadenopathy appreciated on exam.  Cardiopulmonary exam feels clear lung sounds in all fields.  Patient was swabbed for influenza and COVID at triage.  The influenza test is negative.  I discussed with patient that his symptoms are consistent with a viral illness and I am concerned that it might possibly be COVID.  I am can to discharge him home to isolate pending the results.  If he test positive I will treat him with Paxlovid.  He has a CMP from June 2022 in epic that showed a GFR greater than 60.  Patient given Carolynn Serve is a spray to help with the nasal congestion and Tessalon Perles help with his mild cough.  I also advised patient he continue to use over-the-counter Delsym as needed for cough or he can return for worsening symptoms.  Patient has allergy to ibuprofen so advised him to use Tylenol for fever and body aches.   Final Clinical Impressions(s) / UC Diagnoses   Final diagnoses:  Viral URI with cough     Discharge Instructions      Isolate at home pending the results of your COVID test.  If you test positive then you will have to quarantine for 5 days from the start of your symptoms.  After 5 days you can break quarantine if your symptoms have improved and you have not had a fever for 24 hours without taking Tylenol or ibuprofen.  Use over-the-counter Tylenol as needed for body aches and fever.  Use  the Atrovent nasal spray, 2 squirts in each nostril every 6 hours, as needed for runny nose and postnasal drip.  Use the Tessalon Perles every 8 hours during the day.  Take them with a small sip of water.  They may give you some numbness to the base of your tongue or a metallic taste in your mouth, this is normal.  Return for reevaluation or see your primary care provider for any new or worsening symptoms.    If you develop any increased shortness of breath-especially at rest, you are unable to speak in full sentences, or is a late sign your lips are turning blue you need to go the ER for evaluation.      ED Prescriptions     Medication Sig Dispense Auth. Provider   benzonatate (TESSALON) 100 MG capsule Take 2 capsules (200 mg total) by mouth every 8 (eight) hours. 21 capsule Margarette Canada, NP   ipratropium (ATROVENT) 0.06 % nasal spray Place 2 sprays into both nostrils 4 (four) times daily. 15 mL Margarette Canada, NP      PDMP not reviewed this encounter.   Margarette Canada, NP 05/01/21 1140

## 2021-05-02 LAB — SARS CORONAVIRUS 2 (TAT 6-24 HRS): SARS Coronavirus 2: NEGATIVE

## 2021-05-03 ENCOUNTER — Telehealth: Payer: Self-pay | Admitting: Urology

## 2021-05-03 DIAGNOSIS — R319 Hematuria, unspecified: Secondary | ICD-10-CM

## 2021-05-03 NOTE — Telephone Encounter (Signed)
[place] ordered

## 2021-05-03 NOTE — Progress Notes (Incomplete)
05/03/21 5:54 PM   Brandon Diaz August 28, 1955 [id number]  Referring provider:  Baxter Hire, MD [place],  [place] [address] No chief complaint on file.    HPI: Brandon Diaz is a 65 y.o.male with a personal history of prostate cancer, who presents today for further evaluation of possible kidney stones.   Surgical pathology consistent with Gleason 3+4 pT3a with presence of nonfocal extra prostatic extension, positive margin at left apex, negative lymph nodes, negative seminal vesicle invasion.  He has a history of kidney stons   PMH: Past Medical History:  Diagnosis Date   Abnormal prostate specific antigen 12/01/2014   CA of prostate ([place]) 12/01/2014   followed by urologist    Decreased libido    DVT (deep venous thrombosis) ([place])    Erectile dysfunction 12/01/2014   High cholesterol    Prostate cancer ([place]) 2015   Dr. Erlene Quan    Surgical History: Past Surgical History:  Procedure Laterality Date   COLONOSCOPY  2014   [facility]   HERNIA REPAIR  2637   umilical    PELVIC LYMPH NODE DISSECTION Bilateral 11/30/2020   Procedure: PELVIC LYMPH NODE DISSECTION;  Surgeon: Hollice Espy, MD;  Location: [facility] ORS;  Service: Urology;  Laterality: Bilateral;   ROBOT ASSISTED LAPAROSCOPIC RADICAL PROSTATECTOMY N/A 11/30/2020   Procedure: XI ROBOTIC ASSISTED LAPAROSCOPIC RADICAL PROSTATECTOMY;  Surgeon: Hollice Espy, MD;  Location: [facility] ORS;  Service: Urology;  Laterality: N/A;    Home Medications:  Allergies as of 05/04/2021       Reactions   Apixaban Hives, Itching, Shortness Of Breath, Swelling   Ibuprofen Shortness Of Breath   Sudafed [pseudoephedrine Hcl] Shortness Of Breath   Shortness of breath   Xarelto [rivaroxaban] Hives, Itching, Shortness Of Breath, Swelling        Medication List        Accurate as of May 03, 2021  5:54 PM. If you have any questions, ask your nurse or doctor.          aspirin 325 MG EC tablet Take 325 mg by  mouth daily as needed for pain.   benzonatate 100 MG capsule Commonly known as: TESSALON Take 2 capsules (200 mg total) by mouth every 8 (eight) hours.   dextromethorphan 30 MG/5ML liquid Commonly known as: DELSYM Take 60 mg by mouth 2 (two) times daily as needed for cough.   ipratropium 0.06 % nasal spray Commonly known as: ATROVENT Place 2 sprays into both nostrils 4 (four) times daily.   rosuvastatin 10 MG tablet Commonly known as: CRESTOR Take 10 mg by mouth 3 (three) times a week.   sildenafil 20 MG tablet Commonly known as: REVATIO 1-5 tablets as needed 1 hour prior to intercourse        Allergies:  Allergies  Allergen Reactions   Apixaban Hives, Itching, Shortness Of Breath and Swelling   Ibuprofen Shortness Of Breath   Sudafed [Pseudoephedrine Hcl] Shortness Of Breath    Shortness of breath   Xarelto [Rivaroxaban] Hives, Itching, Shortness Of Breath and Swelling    Family History: Family History  Problem Relation Age of Onset   Stroke Mother    Pancreatic cancer Brother    Kidney failure Father    Prostate cancer Brother     Social History:  reports that he quit smoking about 35 years ago. He has never used smokeless tobacco. He reports that he does not drink alcohol and does not use drugs.   Physical Exam: There  were no vitals taken for this visit.  Constitutional:  Alert and oriented, No acute distress. HEENT: [place] AT, moist mucus membranes.  Trachea midline, no masses. Cardiovascular: No clubbing, cyanosis, or edema. Respiratory: Normal respiratory effort, no increased work of breathing. Skin: No rashes, bruises or suspicious lesions. Neurologic: Grossly intact, no focal deficits, moving all 4 extremities. Psychiatric: Normal mood and affect.  Laboratory Data:  Lab Results  Component Value Date   CREATININE 1.10 12/01/2020    Lab Results  Component Value Date   PSA 4.27 (H) 07/15/2016   PSA 5.7 08/15/2014     Urinalysis   Pertinent  Imaging:   Assessment & Plan:     No follow-ups on file.  I,Kailey Littlejohn,acting as a Education administrator for Hollice Espy, MD.,have documented all relevant documentation on the behalf of Hollice Espy, MD,as directed by  Hollice Espy, MD while in the presence of Hollice Espy, [place] [address], [place], [place] [address] [phone]

## 2021-05-03 NOTE — Telephone Encounter (Signed)
Patient called the office today with complaint of possible kidney stones.  He states that he is having pain, pressure and blood in his urine.  Patient states that he has had kidney stones in the past and feels like that is what is causing his symptoms.   Please place order for KUB.  I made him an appt to see Dr. Erlene Quan on 11/8 at his request.

## 2021-05-04 ENCOUNTER — Ambulatory Visit: Payer: BC Managed Care – PPO | Admitting: Urology

## 2021-05-15 IMAGING — US US EXTREM LOW VENOUS*L*
1 series · 13 of 24 positions shown · non-contrast
Comparison: Right lower extremity ultrasound August 08, 2018.

CLINICAL DATA: Left leg and posterior calf pain and swelling
concern for DVT



[Series 1: us venous img lower uni left (dvt) · portal-venous · 13 of 49 slices shown]
[im 1/49]
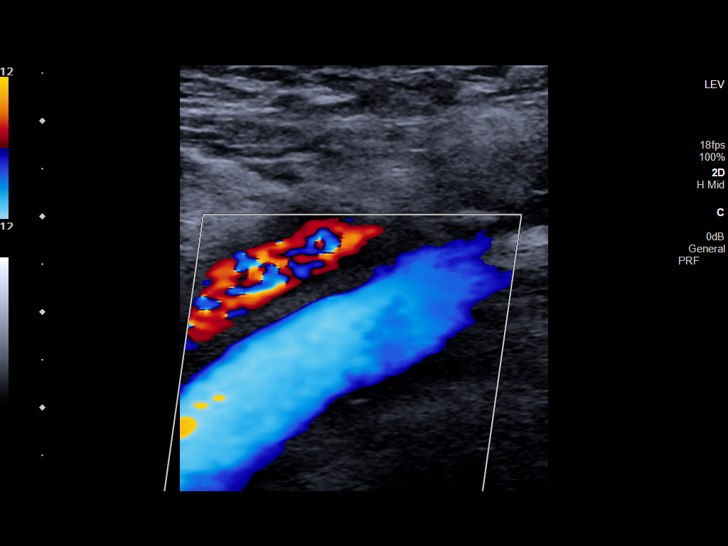
[im 5/49]
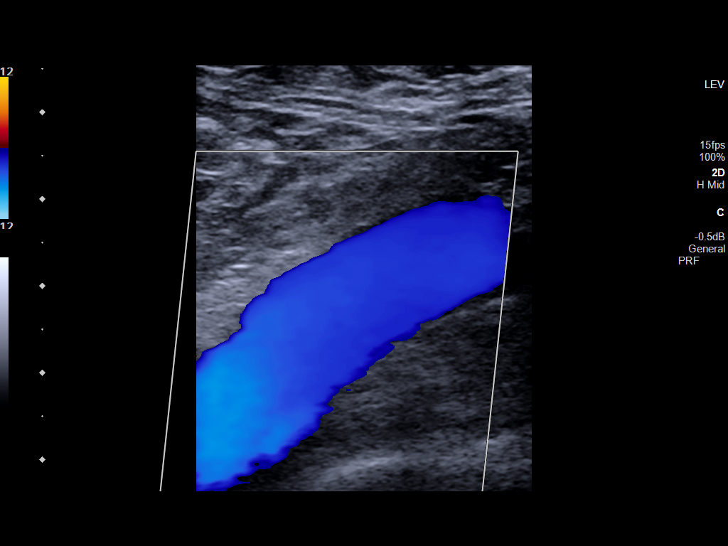
[im 9/49]
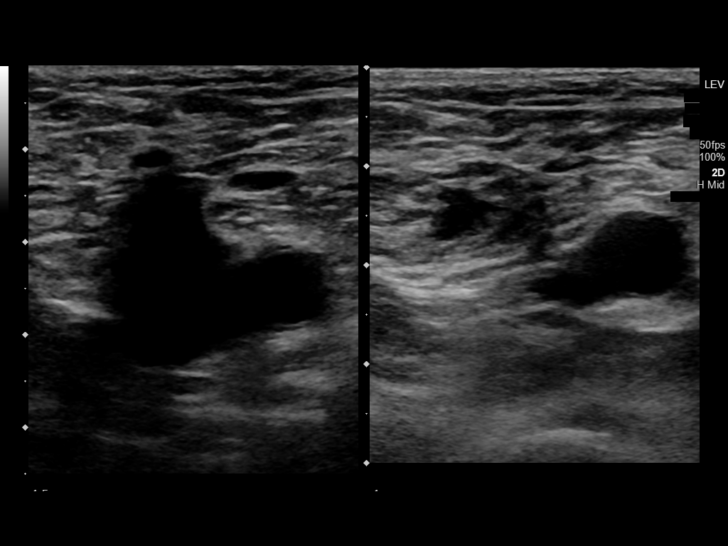
[im 13/49]
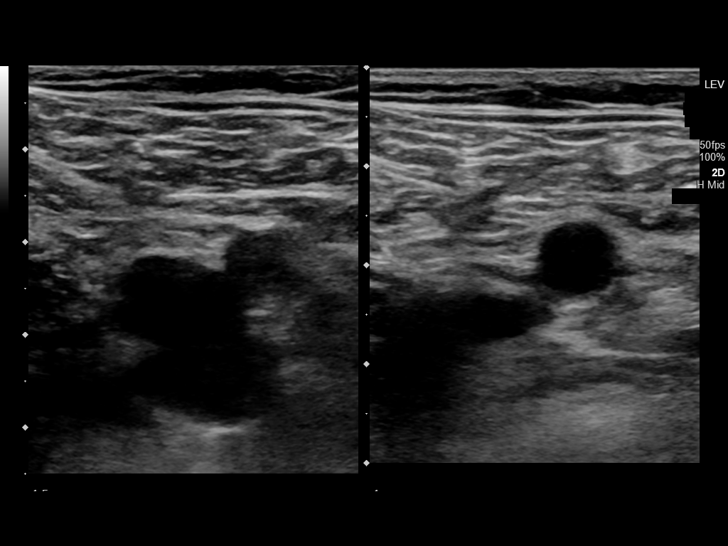
[im 17/49]
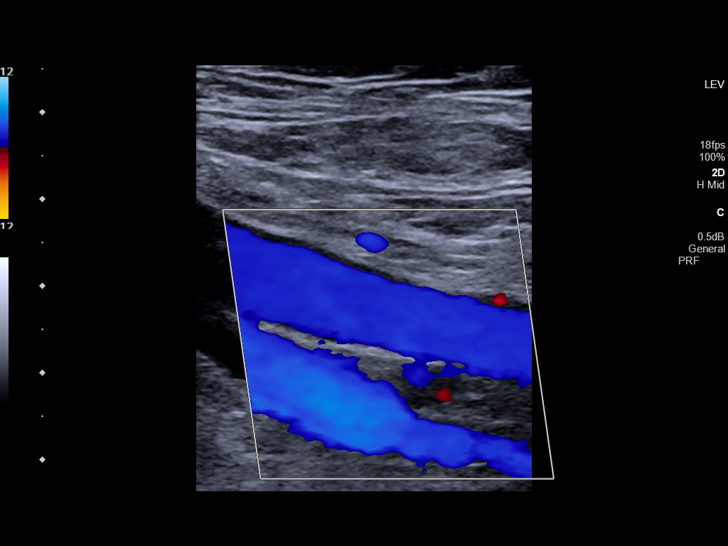
[im 21/49]
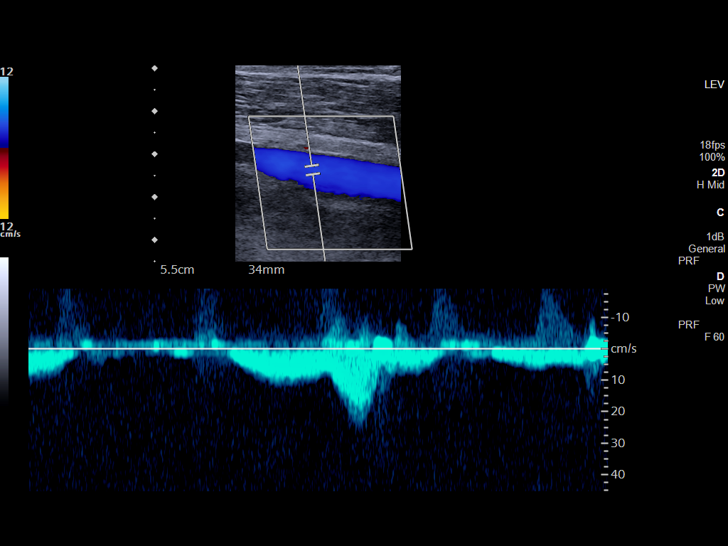
[im 26/49]
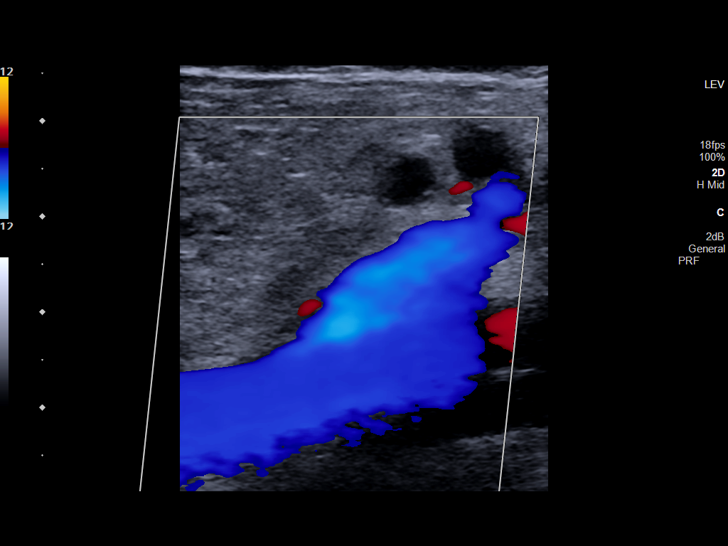
[im 28/49]
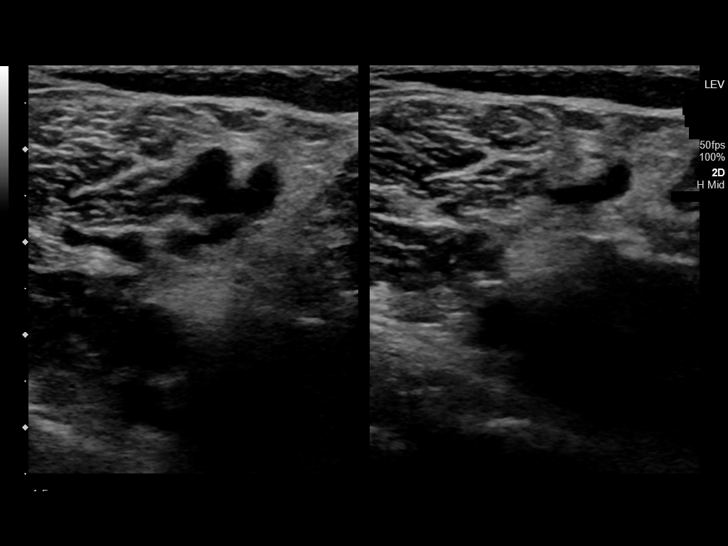
[im 32/49]
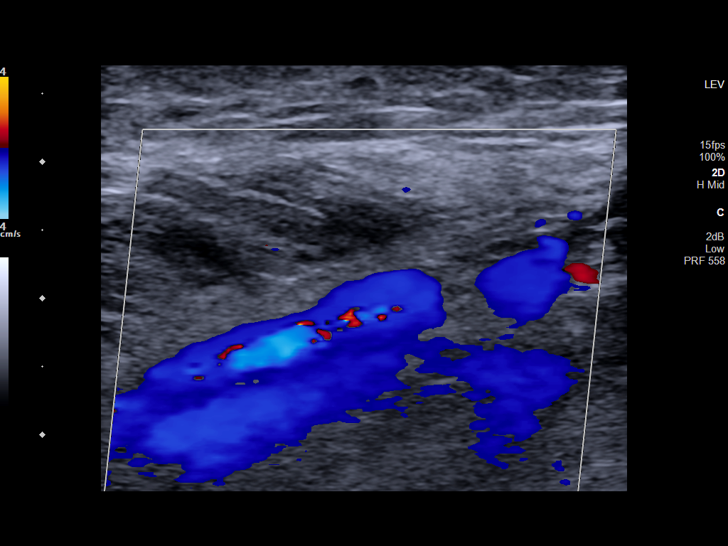
[im 36/49]
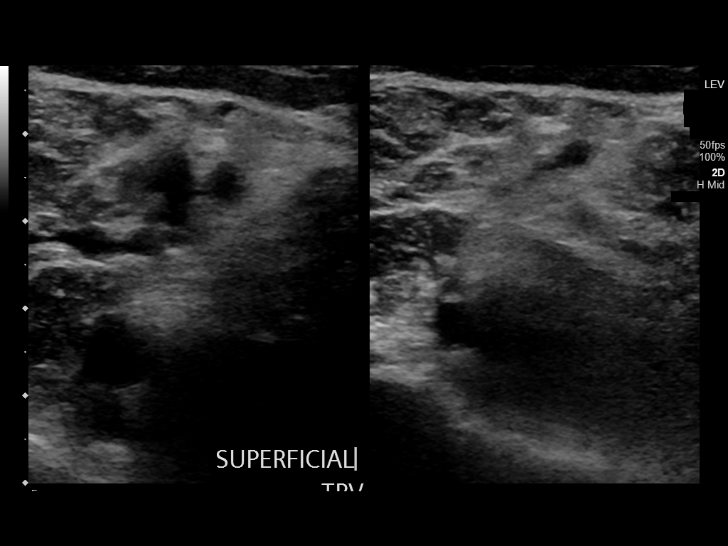
[im 40/49]
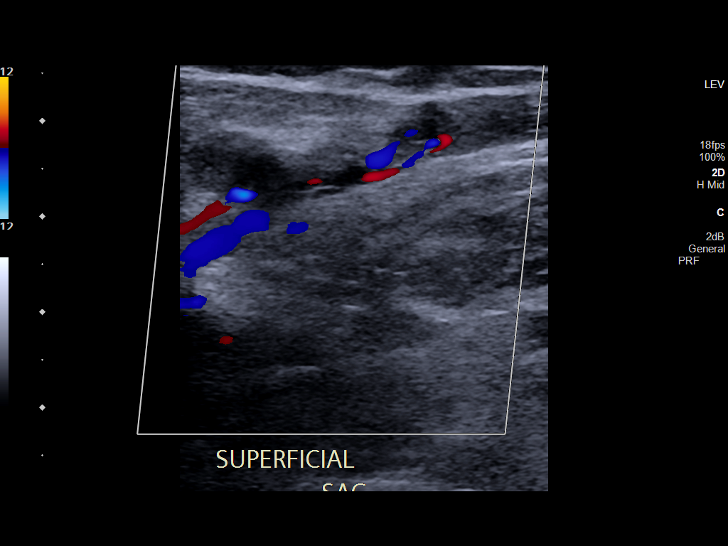
[im 44/49]
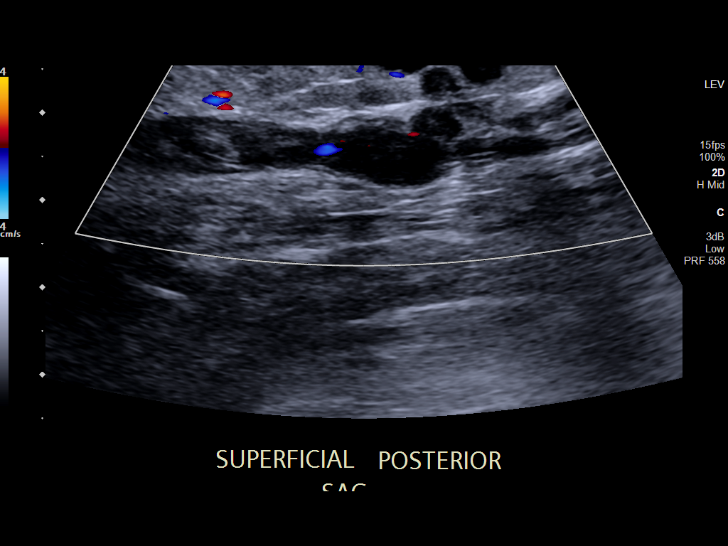
[im 49/49]
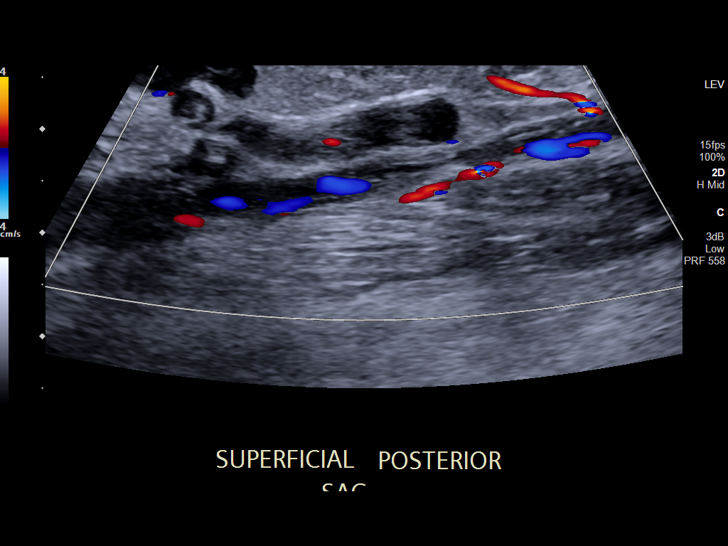

[13 of 24 positions shown; findings below may reference images not displayed]

FINDINGS: Contralateral Common Femoral Vein: Respiratory phasicity is normal
and symmetric with the symptomatic side. No evidence of thrombus.
Normal compressibility.

Common Femoral Vein: No evidence of thrombus. Normal
compressibility, respiratory phasicity and response to augmentation.

Saphenofemoral Junction: No evidence of thrombus. Normal
compressibility and flow on color Doppler imaging.

Profunda Femoral Vein: No evidence of thrombus. Normal
compressibility and flow on color Doppler imaging.

Femoral Vein: No evidence of thrombus. Normal compressibility,
respiratory phasicity and response to augmentation.

Popliteal Vein: No evidence of thrombus. Normal compressibility,
respiratory phasicity and response to augmentation.

Calf Veins: No evidence of thrombus. Normal compressibility and flow
on color Doppler imaging.

Superficial Great Saphenous Vein: Nonocclusive thrombus with loss of
compressibility, which extends into the superficial veins of the
popliteal fossa and calf.

Venous Reflux:  None.

Other Findings:  None.
IMPRESSION: No evidence of DVT within the left lower extremity. Findings
compatible with superficial thrombophlebitis of the left greater
saphenous vein extending into the superficial veins of the popliteal
fossa and calf.

## 2021-07-22 ENCOUNTER — Other Ambulatory Visit: Payer: Self-pay

## 2021-07-22 ENCOUNTER — Other Ambulatory Visit: Payer: BC Managed Care – PPO

## 2021-07-22 DIAGNOSIS — C61 Malignant neoplasm of prostate: Secondary | ICD-10-CM

## 2021-07-23 LAB — PSA: Prostate Specific Ag, Serum: 0.2 ng/mL (ref 0.0–4.0)

## 2021-07-27 NOTE — Progress Notes (Signed)
07/28/21 10:55 AM   [name] [birth year] [id number]  Referring provider:  Baxter Hire, MD [place],  [place] [address] Chief Complaint  Patient presents with   Prostate Cancer     HPI: Brandon Diaz is a 66 y.o.male with a personal history of prostate cancer  who presents today for 6 month PSA prior.   He underwent radical robotic prostatectomy with bilateral pelvic lymph node dissection on 11/30/2020.   Surgical pathology consistent with Gleason 3+4 pT3a with presence of nonfocal extra prostatic extension, positive margin at left apex, negative lymph nodes, negative seminal vesicle invasion.   PSA checked on 07/22/2021 was 0.2. Previously undetectable.    He reports that he believes he went back to work too soon and he still leaks, he wears depends. When he is at home he does not have the same problems. He is not doing his Kegel exercises.   PMH: Past Medical History:  Diagnosis Date   Abnormal prostate specific antigen 12/01/2014   CA of prostate ([place]) 12/01/2014   followed by urologist    Decreased libido    DVT (deep venous thrombosis) ([place])    Erectile dysfunction 12/01/2014   High cholesterol    Prostate cancer ([place]) 2015   Dr. Erlene Quan    Surgical History: Past Surgical History:  Procedure Laterality Date   COLONOSCOPY  2014   [facility]   HERNIA REPAIR  2248   umilical    PELVIC LYMPH NODE DISSECTION Bilateral 11/30/2020   Procedure: PELVIC LYMPH NODE DISSECTION;  Surgeon: Hollice Espy, MD;  Location: [facility] ORS;  Service: Urology;  Laterality: Bilateral;   ROBOT ASSISTED LAPAROSCOPIC RADICAL PROSTATECTOMY N/A 11/30/2020   Procedure: XI ROBOTIC ASSISTED LAPAROSCOPIC RADICAL PROSTATECTOMY;  Surgeon: Hollice Espy, MD;  Location: [facility] ORS;  Service: Urology;  Laterality: N/A;    Home Medications:  Allergies as of 07/28/2021       Reactions   Apixaban Hives, Itching, Shortness Of Breath, Swelling   Ibuprofen Shortness Of Breath    Sudafed [pseudoephedrine Hcl] Shortness Of Breath   Shortness of breath   Xarelto [rivaroxaban] Hives, Itching, Shortness Of Breath, Swelling        Medication List        Accurate as of July 28, 2021 10:55 AM. If you have any questions, ask your nurse or doctor.          STOP taking these medications    benzonatate 100 MG capsule Commonly known as: TESSALON Stopped by: Hollice Espy, MD   sildenafil 20 MG tablet Commonly known as: REVATIO Stopped by: Hollice Espy, MD       TAKE these medications    aspirin 325 MG EC tablet Take 325 mg by mouth daily as needed for pain.   dextromethorphan 30 MG/5ML liquid Commonly known as: DELSYM Take 60 mg by mouth 2 (two) times daily as needed for cough.   ipratropium 0.06 % nasal spray Commonly known as: ATROVENT Place 2 sprays into both nostrils 4 (four) times daily.   rosuvastatin 10 MG tablet Commonly known as: CRESTOR Take 10 mg by mouth 3 (three) times a week.        Allergies:  Allergies  Allergen Reactions   Apixaban Hives, Itching, Shortness Of Breath and Swelling   Ibuprofen Shortness Of Breath   Sudafed [Pseudoephedrine Hcl] Shortness Of Breath    Shortness of breath   Xarelto [Rivaroxaban] Hives, Itching, Shortness Of Breath and Swelling    Family History: Family History  Problem Relation Age of Onset   Stroke Mother    Pancreatic cancer Brother    Kidney failure Father    Prostate cancer Brother     Social History:  reports that he quit smoking about 36 years ago. His smoking use included cigarettes. He has never used smokeless tobacco. He reports that he does not drink alcohol and does not use drugs.   Physical Exam: BP (!) 175/87    Pulse 88    Ht 5\' 8"  (1.727 m)    Wt 188 lb (85.3 kg)    BMI 28.59 kg/m   Constitutional:  Alert and oriented, No acute distress. HEENT: [place] AT, moist mucus membranes.  Trachea midline, no masses. Cardiovascular: No clubbing, cyanosis, or  edema. Respiratory: Normal respiratory effort, no increased work of breathing. Skin: No rashes, bruises or suspicious lesions. Neurologic: Grossly intact, no focal deficits, moving all 4 extremities. Psychiatric: Normal mood and affect.  Laboratory Data:  Lab Results  Component Value Date   CREATININE 1.10 12/01/2020    Lab Results  Component Value Date   PSA 4.27 (H) 07/15/2016   PSA 5.7 08/15/2014   Assessment & Plan:    1. Prostate cancer ([place]) - PSA meets the criteria for a biochemical reoccurrence  - Discussed the role of salvage radiation plus 22mo ADT (Eligard) and the risk and benefits in detail. -Discussed side effects of ADT, literature given - Referral sent to radiation oncology.   2. SUI (stress urinary incontinence), male - Highly recommend he work on his pelvic floor exercises (Kegel exercises) to build his pelvic floor muscles especially in light of future salvage radiation.    Return in 2 weeks for Eligard with PA and in 6 months for PSA.   I,Kailey Littlejohn,acting as a Education administrator for Hollice Espy, MD.,have documented all relevant documentation on the behalf of Hollice Espy, MD,as directed by  Hollice Espy, MD while in the presence of Hollice Espy, MD.  I have reviewed the above documentation for accuracy and completeness, and I agree with the above.   Hollice Espy, MD   [facility] [address], [place], [place] [address] [phone]  I spent 35 total minutes on the day of the encounter including pre-visit review of the medical record, face-to-face time with the patient, and post visit ordering of labs/imaging/tests.  Majority of time was spend face to face with patient/

## 2021-07-28 ENCOUNTER — Ambulatory Visit (INDEPENDENT_AMBULATORY_CARE_PROVIDER_SITE_OTHER): Payer: BC Managed Care – PPO | Admitting: Urology

## 2021-07-28 ENCOUNTER — Other Ambulatory Visit: Payer: Self-pay

## 2021-07-28 ENCOUNTER — Encounter: Payer: Self-pay | Admitting: Urology

## 2021-07-28 VITALS — BP 175/87 | HR 88 | Ht 68.0 in | Wt 188.0 lb

## 2021-07-28 DIAGNOSIS — C61 Malignant neoplasm of prostate: Secondary | ICD-10-CM | POA: Diagnosis not present

## 2021-07-30 ENCOUNTER — Telehealth: Payer: Self-pay

## 2021-07-30 NOTE — Telephone Encounter (Signed)
Benefits eligibility forms faxed for Eliagrd.

## 2021-08-05 ENCOUNTER — Institutional Professional Consult (permissible substitution): Payer: BC Managed Care – PPO | Admitting: Radiation Oncology

## 2021-08-05 NOTE — Telephone Encounter (Signed)
Incoming benefits for Eligard, No PA required.

## 2021-08-17 ENCOUNTER — Ambulatory Visit: Payer: BC Managed Care – PPO

## 2021-10-27 ENCOUNTER — Encounter: Payer: Self-pay | Admitting: Urology

## 2022-01-25 ENCOUNTER — Other Ambulatory Visit: Payer: BC Managed Care – PPO

## 2022-02-02 ENCOUNTER — Ambulatory Visit: Payer: BC Managed Care – PPO | Admitting: Urology

## 2022-03-22 ENCOUNTER — Other Ambulatory Visit: Payer: Self-pay | Admitting: Internal Medicine

## 2022-03-22 ENCOUNTER — Ambulatory Visit
Admission: RE | Admit: 2022-03-22 | Discharge: 2022-03-22 | Disposition: A | Discharge status: Home / Self Care | Payer: BC Managed Care – PPO | Source: Ambulatory Visit | Attending: Internal Medicine | Admitting: Internal Medicine

## 2022-03-22 DIAGNOSIS — M7989 Other specified soft tissue disorders: Secondary | ICD-10-CM | POA: Insufficient documentation

## 2022-03-22 DIAGNOSIS — M79662 Pain in left lower leg: Secondary | ICD-10-CM | POA: Diagnosis present

## 2022-04-25 ENCOUNTER — Encounter (INDEPENDENT_AMBULATORY_CARE_PROVIDER_SITE_OTHER): Payer: Self-pay

## 2022-12-02 ENCOUNTER — Ambulatory Visit
Admission: RE | Admit: 2022-12-02 | Discharge: 2022-12-02 | Disposition: A | Discharge status: Home / Self Care | Payer: Worker's Compensation | Source: Ambulatory Visit | Attending: Physician Assistant | Admitting: Physician Assistant

## 2022-12-02 ENCOUNTER — Other Ambulatory Visit: Payer: Self-pay | Admitting: Physician Assistant

## 2022-12-02 DIAGNOSIS — W19XXXA Unspecified fall, initial encounter: Secondary | ICD-10-CM | POA: Insufficient documentation

## 2022-12-02 DIAGNOSIS — Y990 Civilian activity done for income or pay: Secondary | ICD-10-CM

## 2022-12-02 DIAGNOSIS — M25462 Effusion, left knee: Secondary | ICD-10-CM | POA: Insufficient documentation

## 2022-12-02 DIAGNOSIS — M1712 Unilateral primary osteoarthritis, left knee: Secondary | ICD-10-CM | POA: Insufficient documentation

## 2023-05-29 ENCOUNTER — Ambulatory Visit
Admission: EM | Admit: 2023-05-29 | Discharge: 2023-05-29 | Disposition: A | Discharge status: Home / Self Care | Payer: BC Managed Care – PPO | Attending: Physician Assistant | Admitting: Physician Assistant

## 2023-05-29 DIAGNOSIS — S61002A Unspecified open wound of left thumb without damage to nail, initial encounter: Secondary | ICD-10-CM | POA: Diagnosis not present

## 2023-05-29 DIAGNOSIS — Z23 Encounter for immunization: Secondary | ICD-10-CM

## 2023-05-29 DIAGNOSIS — Z2839 Other underimmunization status: Secondary | ICD-10-CM | POA: Diagnosis not present

## 2023-05-29 DIAGNOSIS — S61001A Unspecified open wound of right thumb without damage to nail, initial encounter: Secondary | ICD-10-CM

## 2023-05-29 MED ORDER — TETANUS-DIPHTH-ACELL PERTUSSIS 5-2.5-18.5 LF-MCG/0.5 IM SUSY
0.5000 mL | PREFILLED_SYRINGE | Freq: Once | INTRAMUSCULAR | Status: AC
  Administered 2023-05-29: 0.5 mL via INTRAMUSCULAR

## 2023-05-29 NOTE — ED Provider Notes (Signed)
[facility]    CSN: [id number] Arrival date & time: 05/29/23  1215      History   Chief Complaint Chief Complaint  Patient presents with   Laceration   Back Pain    HPI Brandon Diaz is a 67 y.o. male presenting for a tetanus immunization.  He reports that he cut his self on a piece of steel 2 weeks ago.  He reports a wound of both thumbs.  He has been cleaning the wounds.  He says they were never very dirty.  He knows that his tetanus immunization is greater than 54 years old.  He reports having a spasm in his lower back yesterday and some soreness in his shoulders but denies locking of the jaw, cramps or spasms in neck, shoulders, mouth, headaches, dizziness, tachycardia, fever, sweats or any signs of infection.  HPI  Past Medical History:  Diagnosis Date   Abnormal prostate specific antigen 12/01/2014   CA of prostate ([facility]) 12/01/2014   followed by urologist    Decreased libido    DVT (deep venous thrombosis) ([facility])    Erectile dysfunction 12/01/2014   High cholesterol    Prostate cancer ([facility]) 2015   Dr. 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   Productive cough 03/29/2016   History of umbilical hernia repair 12/15/2014   Abnormal prostate specific antigen 12/01/2014   CA of prostate ([facility]) 12/01/2014   Screening for human immunodeficiency virus 12/01/2014   Erectile dysfunction 12/01/2014   HLD (hyperlipidemia) 03/26/2010   Decreased libido 07/16/2008    Past Surgical History:  Procedure Laterality Date   COLONOSCOPY  2014   [facility]   HERNIA REPAIR  2013   umilical    PELVIC LYMPH NODE DISSECTION Bilateral 11/30/2020   Procedure: PELVIC LYMPH NODE  DISSECTION;  Surgeon: Vanna Scotland, MD;  Location: [facility] ORS;  Service: Urology;  Laterality: Bilateral;   ROBOT ASSISTED LAPAROSCOPIC RADICAL PROSTATECTOMY N/A 11/30/2020   Procedure: XI ROBOTIC ASSISTED LAPAROSCOPIC RADICAL PROSTATECTOMY;  Surgeon: Vanna Scotland, MD;  Location: [facility] ORS;  Service: Urology;  Laterality: N/A;       Home Medications    Prior to Admission medications   Medication Sig Start Date End Date Taking? Authorizing Provider  rosuvastatin (CRESTOR) 10 MG tablet Take 10 mg by mouth 3 (three) times a week.   Yes [provider]  aspirin 325 MG EC tablet Take 325 mg by mouth daily as needed for pain.    [provider]  dextromethorphan (DELSYM) 30 MG/5ML liquid Take 60 mg by mouth 2 (two) times daily as needed for cough.    [provider]  ipratropium (ATROVENT) 0.06 % nasal spray Place 2 sprays into both nostrils 4 (four) times daily. 05/01/21   Becky Augusta, NP    Family History Family History  Problem Relation Age of Onset   Stroke Mother    Pancreatic cancer Brother    Kidney failure Father    Prostate cancer Brother     Social History Social History   Tobacco Use   Smoking status: Former    Current packs/day: 0.00    Types: Cigarettes    Quit date: 07/02/1985    Years  since quitting: 37.9   Smokeless tobacco: Never  Vaping Use   Vaping status: Never Used  Substance Use Topics   Alcohol use: No    Alcohol/week: 0.0 standard drinks of alcohol   Drug use: No     Allergies   Apixaban, Ibuprofen, Sudafed [pseudoephedrine hcl], and Xarelto [rivaroxaban]   Review of Systems Review of Systems  Constitutional:  Negative for diaphoresis, fatigue and fever.  Respiratory:  Negative for chest tightness and shortness of breath.   Cardiovascular:  Negative for chest pain and palpitations.  Gastrointestinal:  Negative for nausea and vomiting.  Musculoskeletal:  Positive for myalgias. Negative for back pain, neck pain and neck  stiffness.  Skin:  Positive for wound. Negative for color change.  Neurological:  Negative for dizziness, weakness, numbness and headaches.     Physical Exam Triage Vital Signs ED Triage Vitals  Encounter Vitals Group     BP 05/29/23 1356 (S) (!) 188/61     Systolic BP Percentile --      Diastolic BP Percentile --      Pulse Rate 05/29/23 1356 60     Resp --      Temp 05/29/23 1356 98.1 F (36.7 C)     Temp Source 05/29/23 1356 Oral     SpO2 05/29/23 1356 97 %     Weight --      Height --      Head Circumference --      Peak Flow --      Pain Score 05/29/23 1355 0     Pain Loc --      Pain Education --      Exclude from Growth Chart --    No data found.  Updated Vital Signs BP (!) 175/92   Pulse 60   Temp 98.1 F (36.7 C) (Oral)   SpO2 97%   Visual Acuity Right Eye Distance:   Left Eye Distance:   Bilateral Distance:    Right Eye Near:   Left Eye Near:    Bilateral Near:     Physical Exam Vitals and nursing note reviewed.  Constitutional:      General: He is not in acute distress.    Appearance: Normal appearance. He is well-developed. He is not ill-appearing.  HENT:     Head: Normocephalic and atraumatic.  Eyes:     General: No scleral icterus.    Extraocular Movements: Extraocular movements intact.     Conjunctiva/sclera: Conjunctivae normal.     Pupils: Pupils are equal, round, and reactive to light.  Cardiovascular:     Rate and Rhythm: Normal rate and regular rhythm.     Heart sounds: Normal heart sounds.  Pulmonary:     Effort: Pulmonary effort is normal. No respiratory distress.     Breath sounds: Normal breath sounds.  Musculoskeletal:     Cervical back: Normal range of motion and neck supple. No rigidity or tenderness.     Comments: Mild tenderness of bilateral traps.  No spasm noted.  Speaking normally and able to fully open jaw.  No soreness of jaw.  No tenderness of back and full range of motion of back.  Skin:    General: Skin is warm  and dry.     Capillary Refill: Capillary refill takes less than 2 seconds.     Comments: There is 1 small open wound of bilateral dorsal thumbs which appear to be well-healing.  No surrounding erythema, bleeding or drainage.  No tenderness.  Neurological:  General: No focal deficit present.     Mental Status: He is alert and oriented to person, place, and time. Mental status is at baseline.     Motor: No weakness.     Coordination: Coordination normal.     Gait: Gait normal.  Psychiatric:        Mood and Affect: Mood normal.        Behavior: Behavior normal.      UC Treatments / Results  Labs (all labs ordered are listed, but only abnormal results are displayed) Labs Reviewed - No data to display  EKG   Radiology No results found.  Procedures Procedures (including critical care time)  Medications Ordered in UC Medications  Tdap (BOOSTRIX) injection 0.5 mL (0.5 mLs Intramuscular Given 05/29/23 1420)    Initial Impression / Assessment and Plan / UC Course  I have reviewed the triage vital signs and the nursing notes.  Pertinent labs & imaging results that were available during my care of the patient were reviewed by me and considered in my medical decision making (see chart for details).   67 year old male who presents for tetanus immunization.  He states has been greater than 10 years since his last tetanus immunization.  He sustained a couple of minor wounds on both thumbs 2 weeks ago when he cut them on stainless steel.  He says he had a spasm in his back yesterday and became concerned that he might need to get his tetanus updated.  He denies any spasms in his neck, mouth, jaw, locking of jaw, dizziness, tachycardia, sweats, fever, etc.  Blood pressure is elevated at 175/92.  Exam significant for mild tenderness of bilateral trapezius muscles but no spasms noted.  Able to fully open jaw and mouth without difficulty.  2 small open wounds on thumbs which appear to be  well-healing/nearly healed.  Chest clear.  Tdap updated.  Discussed signs and symptoms of tetanus and advised if he has any of these signs or symptoms go to the emergency department but extremely low suspicion for this at this time based on his exam.  Encouraged to continue wound care and to return for any signs of infection or other concerns.   Final Clinical Impressions(s) / UC Diagnoses   Final diagnoses:  Not up to date with diphtheria-tetanus vaccination  Open wound of left thumb, initial encounter  Open wound of right thumb, initial encounter     Discharge Instructions      -We have updated your tetanus immunization today.  Should be good for 10 years. - Keep an eye on your blood pressure.  If consistently elevated follow-up with your primary care provider. - If you have any locking of your jaw, spasms in your jaw/neck, difficulty swallowing, fever, racing heart, dizziness, weakness, severe headaches, etc. please go to the ER. -Wounds look to be healing nicely.  Continue to keep clean.  Return for any signs of infection.    ED Prescriptions   None    PDMP not reviewed this encounter.   Shirlee Latch, PA-C 05/29/23 1427

## 2023-05-29 NOTE — Discharge Instructions (Signed)
-  We have updated your tetanus immunization today.  Should be good for 10 years. - Keep an eye on your blood pressure.  If consistently elevated follow-up with your primary care provider. - If you have any locking of your jaw, spasms in your jaw/neck, difficulty swallowing, fever, racing heart, dizziness, weakness, severe headaches, etc. please go to the ER. -Wounds look to be healing nicely.  Continue to keep clean.  Return for any signs of infection.

## 2023-05-29 NOTE — ED Triage Notes (Addendum)
Pt presents to UC needing to update tetanus. Pt cut bilateral thumbs with stainless steel last week some time.   Pt also states he had some back spasms x couple days ago, denies any injury.

## 2023-06-22 ENCOUNTER — Ambulatory Visit
Admission: EM | Admit: 2023-06-22 | Discharge: 2023-06-22 | Disposition: A | Discharge status: Home / Self Care | Payer: BC Managed Care – PPO | Attending: Physician Assistant | Admitting: Physician Assistant

## 2023-06-22 ENCOUNTER — Ambulatory Visit: Payer: BC Managed Care – PPO

## 2023-06-22 DIAGNOSIS — R0602 Shortness of breath: Secondary | ICD-10-CM

## 2023-06-22 DIAGNOSIS — U071 COVID-19: Secondary | ICD-10-CM | POA: Diagnosis present

## 2023-06-22 DIAGNOSIS — R051 Acute cough: Secondary | ICD-10-CM | POA: Diagnosis present

## 2023-06-22 LAB — GROUP A STREP BY PCR: Group A Strep by PCR: NOT DETECTED

## 2023-06-22 LAB — RESP PANEL BY RT-PCR (FLU A&B, COVID) ARPGX2
Influenza A by PCR: NEGATIVE
Influenza B by PCR: NEGATIVE
SARS Coronavirus 2 by RT PCR: POSITIVE — AB

## 2023-06-22 MED ORDER — NIRMATRELVIR/RITONAVIR (PAXLOVID)TABLET: 3.0000 | ORAL_TABLET | Freq: Two times a day (BID) | ORAL | 0 refills | Status: AC

## 2023-06-22 NOTE — ED Triage Notes (Signed)
Sx x 3-4 days  Fever Sore throat Body aches  non productive cough Headache Body aches  Taking tylenol

## 2023-06-22 NOTE — ED Provider Notes (Signed)
[facility]    CSN: [id number] Arrival date & time: 06/22/23  1410      History   Chief Complaint Chief Complaint  Patient presents with   Cough   Fever   Sore Throat   Headache   Generalized Body Aches    HPI Brandon Diaz is a 67 y.o. male patient presenting for 3 to 4-day history of feeling feverish with cough, congestion, sore throat, body aches, headaches.  Denies recorded fever, ear pain, sinus pain, chest pain, shortness of breath, abdominal pain, vomiting or diarrhea.  Has been taking Tylenol for his body aches.  Reports his wife is sick with similar symptoms.  She had a negative flu test and is waiting on her COVID results.  No other complaints.  HPI  Past Medical History:  Diagnosis Date   Abnormal prostate specific antigen 12/01/2014   CA of prostate (HCC) 12/01/2014   followed by urologist    Decreased libido    DVT (deep venous thrombosis) (HCC)    Erectile dysfunction 12/01/2014   High cholesterol    Prostate cancer (HCC) 2015   Dr. 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   Productive cough 03/29/2016   History of umbilical hernia repair 12/15/2014   Abnormal prostate specific antigen 12/01/2014   CA of prostate (HCC) 12/01/2014   Screening for human immunodeficiency virus 12/01/2014   Erectile dysfunction 12/01/2014   HLD (hyperlipidemia) 03/26/2010   Decreased libido 07/16/2008    Past Surgical History:  Procedure Laterality Date   COLONOSCOPY  2014   [facility]   HERNIA REPAIR  2013   umilical    PELVIC LYMPH NODE DISSECTION Bilateral 11/30/2020   Procedure: PELVIC LYMPH NODE DISSECTION;  Surgeon: Vanna Scotland, MD;  Location: [facility] ORS;   Service: Urology;  Laterality: Bilateral;   ROBOT ASSISTED LAPAROSCOPIC RADICAL PROSTATECTOMY N/A 11/30/2020   Procedure: XI ROBOTIC ASSISTED LAPAROSCOPIC RADICAL PROSTATECTOMY;  Surgeon: Vanna Scotland, MD;  Location: [facility] OR[facility];  Service: Urology;  Laterality: N/A;       Home Medications    Prior to Admission medications   Medication Sig Start Date End Date Taking? Authorizing Provider  nirmatrelvir/ritonavir (PAXLOVID) 20 x 150 MG & 10 x 100MG  TABS Take 3 tablets by mouth 2 (two) times daily for 5 days. Patient GFR is 84. Take nirmatrelvir (150 mg) two tablets twice daily for 5 days and ritonavir (100 mg) one tablet twice daily for 5 days. 06/22/23 06/27/23 Yes Eusebio Friendly B, PA-C  rosuvastatin (CRESTOR) 10 MG tablet Take 10 mg by mouth 3 (three) times a week.   Yes [provider]  aspirin 325 MG EC tablet Take 325 mg by mouth daily as needed for pain.    [provider]  dextromethorphan (DELSYM) 30 MG/5ML liquid Take 60 mg by mouth 2 (two) times daily as needed for cough.    [provider]  ipratropium (ATROVENT) 0.06 % nasal spray Place 2 sprays into both nostrils 4 (four) times daily. 05/01/21   Becky Augusta, NP    Family History Family History  Problem Relation Age of Onset   Stroke Mother    Pancreatic cancer Brother    Kidney failure Father  Prostate cancer Brother     Social History Social History   Tobacco Use   Smoking status: Former    Current packs/day: 0.00    Types: Cigarettes    Quit date: 07/02/1985    Years since quitting: 37.9   Smokeless tobacco: Never  Vaping Use   Vaping status: Never Used  Substance Use Topics   Alcohol use: No    Alcohol/week: 0.0 standard drinks of alcohol   Drug use: No     Allergies   Apixaban, Ibuprofen, Sudafed [pseudoephedrine hcl], and Xarelto [rivaroxaban]   Review of Systems Review of Systems  Constitutional:  Positive for fatigue and fever (subjective).  HENT:  Positive for  congestion, rhinorrhea and sore throat. Negative for ear pain, sinus pressure and sinus pain.   Respiratory:  Positive for cough. Negative for shortness of breath and wheezing.   Cardiovascular:  Negative for chest pain.  Gastrointestinal:  Negative for abdominal pain, diarrhea, nausea and vomiting.  Musculoskeletal:  Positive for myalgias.  Neurological:  Positive for headaches. Negative for weakness and light-headedness.  Hematological:  Negative for adenopathy.     Physical Exam Triage Vital Signs ED Triage Vitals  Encounter Vitals Group     BP      Systolic BP Percentile      Diastolic BP Percentile      Pulse      Resp      Temp      Temp src      SpO2      Weight      Height      Head Circumference      Peak Flow      Pain Score      Pain Loc      Pain Education      Exclude from Growth Chart    No data found.  Updated Vital Signs BP (!) 176/90 (BP Location: Right Arm)   Pulse 73   Temp 98.9 F (37.2 C) (Oral)   Resp (!) 21   SpO2 96%   Physical Exam Vitals and nursing note reviewed.  Constitutional:      General: He is not in acute distress.    Appearance: Normal appearance. He is well-developed. He is not ill-appearing.  HENT:     Head: Normocephalic and atraumatic.     Nose: Congestion present.     Mouth/Throat:     Mouth: Mucous membranes are moist.     Pharynx: Oropharynx is clear.  Eyes:     General: No scleral icterus.    Conjunctiva/sclera: Conjunctivae normal.  Cardiovascular:     Rate and Rhythm: Normal rate and regular rhythm.     Heart sounds: Normal heart sounds.  Pulmonary:     Effort: Pulmonary effort is normal. No respiratory distress.     Breath sounds: Normal breath sounds.  Musculoskeletal:     Cervical back: Neck supple.  Skin:    General: Skin is warm and dry.     Capillary Refill: Capillary refill takes less than 2 seconds.  Neurological:     General: No focal deficit present.     Mental Status: He is alert. Mental status  is at baseline.     Motor: No weakness.     Gait: Gait normal.  Psychiatric:        Mood and Affect: Mood normal.        Behavior: Behavior normal.      UC Treatments / Results  Labs (all labs ordered  are listed, but only abnormal results are displayed) Labs Reviewed  RESP PANEL BY RT-PCR (FLU A&B, COVID) ARPGX2 - Abnormal; Notable for the following components:      Result Value   SARS Coronavirus 2 by RT PCR POSITIVE (*)    All other components within normal limits  GROUP A STREP BY PCR    EKG   Radiology No results found.  Procedures Procedures (including critical care time)  Medications Ordered in UC Medications - No data to display  Initial Impression / Assessment and Plan / UC Course  I have reviewed the triage vital signs and the nursing notes.  Pertinent labs & imaging results that were available during my care of the patient were reviewed by me and considered in my medical decision making (see chart for details).   67 year old male presents for 3 to 4-day history of feeling feverish with fatigue, cough, congestion, sore throat, body aches and headaches.  Wife sick with similar symptoms.  Patient is afebrile.  Overall well-appearing.  No acute distress.  On exam has nasal congestion, erythema posterior pharynx.  Chest clear.  Heart regular rate and rhythm.  Strep and COVID/flu testing obtained.  Positive COVID.  Reviewed current CDC guidelines, isolation protocol and ED precautions.  Patient desires antiviral therapy.  Patient had labs performed 3 months ago and GFR is normal.  Sent Paxlovid to pharmacy.  Advised to hold Crestor for 10 days.  Will continue with over-the-counter cough medicine as needed.  Reviewed return precautions.   Final Clinical Impressions(s) / UC Diagnoses   Final diagnoses:  Shortness of breath  COVID-19  Acute cough     Discharge Instructions      -COVID is positive.  Isolate at least 5 days from symptom onset and wear mask  for 5 days. - Increase rest and fluids. - May take OTC cough meds as needed. - Make sure you do not take your Crestor 10 days from the time you start Paxlovid. - Return or go to ER for fever, worsening cough, pain in chest or shortness of breath.     ED Prescriptions     Medication Sig Dispense Auth. Provider   nirmatrelvir/ritonavir (PAXLOVID) 20 x 150 MG & 10 x 100MG  TABS Take 3 tablets by mouth 2 (two) times daily for 5 days. Patient GFR is 84. Take nirmatrelvir (150 mg) two tablets twice daily for 5 days and ritonavir (100 mg) one tablet twice daily for 5 days. 30 tablet Gareth Morgan      PDMP not reviewed this encounter.   Shirlee Latch, PA-C 06/22/23 [phone]

## 2023-06-22 NOTE — Discharge Instructions (Addendum)
-  COVID is positive.  Isolate at least 5 days from symptom onset and wear mask for 5 days. - Increase rest and fluids. - May take OTC cough meds as needed. - Make sure you do not take your Crestor 10 days from the time you start Paxlovid. - Return or go to ER for fever, worsening cough, pain in chest or shortness of breath.

## 2024-01-08 ENCOUNTER — Emergency Department
Admission: EM | Admit: 2024-01-08 | Discharge: 2024-01-08 | Disposition: A | Discharge status: Home / Self Care | Attending: Emergency Medicine | Admitting: Emergency Medicine

## 2024-01-08 ENCOUNTER — Emergency Department

## 2024-01-08 DIAGNOSIS — D1723 Benign lipomatous neoplasm of skin and subcutaneous tissue of right leg: Secondary | ICD-10-CM | POA: Diagnosis not present

## 2024-01-08 DIAGNOSIS — Z7982 Long term (current) use of aspirin: Secondary | ICD-10-CM | POA: Insufficient documentation

## 2024-01-08 DIAGNOSIS — Z8546 Personal history of malignant neoplasm of prostate: Secondary | ICD-10-CM | POA: Diagnosis not present

## 2024-01-08 DIAGNOSIS — R2241 Localized swelling, mass and lump, right lower limb: Secondary | ICD-10-CM | POA: Diagnosis present

## 2024-01-08 LAB — BASIC METABOLIC PANEL WITH GFR
Anion gap: 8 (ref 5–15)
BUN: 18 mg/dL (ref 8–23)
CO2: 27 mmol/L (ref 22–32)
Calcium: 9 mg/dL (ref 8.9–10.3)
Chloride: 106 mmol/L (ref 98–111)
Creatinine, Ser: 0.83 mg/dL (ref 0.61–1.24)
GFR, Estimated: >60 mL/min (ref >60)
Glucose, Bld: 91 mg/dL (ref 70–99)
Potassium: 4.1 mmol/L (ref 3.5–5.1)
Sodium: 141 mmol/L (ref 135–145)

## 2024-01-08 LAB — CBC
HCT: 47.7 % (ref 39.0–52.0)
Hemoglobin: 15.3 g/dL (ref 13.0–17.0)
MCH: 29 pg (ref 26.0–34.0)
MCHC: 32.1 g/dL (ref 30.0–36.0)
MCV: 90.3 fL (ref 80.0–100.0)
Platelets: 171 K/uL (ref 150–400)
RBC: 5.28 MIL/uL (ref 4.22–5.81)
RDW: 13.2 % (ref 11.5–15.5)
WBC: 2.9 K/uL — ABNORMAL LOW (ref 4.0–10.5)
nRBC: 0 % (ref 0.0–0.2)

## 2024-01-08 NOTE — ED Provider Notes (Signed)
 [facility] Provider Note    Event Date/Time   First MD Initiated Contact with Patient 01/08/24 1157     (approximate)   History   Leg Pain   HPI  Brandon Diaz is a 68 y.o. male   presents to the ED with complaints of a lump in his right thigh for approximately 1 week.  Patient denies any injury, insect bite or pain.  No recent traveling.  He states that he has had a DVT in the past and currently is not on any blood thinners.  He denies any shortness of breath or chest pain.  Patient in addition to DVTs has a history of prostate cancer, leukopenia, neuropathy.  Patient is currently taking an aspirin  a day as he is allergic to Eliquis  and Xarelto .      Physical Exam   Triage Vital Signs: ED Triage Vitals  Encounter Vitals Group     BP 01/08/24 1143 (!) 166/78     Girls Systolic BP Percentile --      Girls Diastolic BP Percentile --      Boys Systolic BP Percentile --      Boys Diastolic BP Percentile --      Pulse Rate 01/08/24 1140 76     Resp 01/08/24 1140 18     Temp 01/08/24 1140 98.2 F (36.8 C)     Temp Source 01/08/24 1140 Oral     SpO2 01/08/24 1140 100 %     Weight 01/08/24 1141 190 lb (86.2 kg)     Height 01/08/24 1141 5' 8 (1.727 m)     Head Circumference --      Peak Flow --      Pain Score 01/08/24 1141 3     Pain Loc --      Pain Education --      Exclude from Growth Chart --     Most recent vital signs: Vitals:   01/08/24 1140 01/08/24 1143  BP:  (!) 166/78  Pulse: 76   Resp: 18   Temp: 98.2 F (36.8 C)   SpO2: 100%      General: Awake, no distress.  CV:  Good peripheral perfusion.  Resp:  Normal effort.  Abd:  No distention.  Other:  Right lower extremity without obvious deformity or pitting edema.  There is a round movable nodule with discrete edges on the mid anterior thigh.  No redness or warmth is noted in this area.  Area is minimally tender.  T[place]a' sign is negative.   ED Results / Procedures /  Treatments   Labs (all labs ordered are listed, but only abnormal results are displayed) Labs Reviewed  CBC - Abnormal; Notable for the following components:      Result Value   WBC 2.9 (*)    All other components within normal limits  BASIC METABOLIC PANEL WITH GFR  PROTIME-INR     EKG  Vent. rate 67 BPM PR interval 170 ms QRS duration 94 ms QT/QTcB 392/414 ms P-R-T axes 66 60 59 Normal sinus rhythm Normal ECG When compared with ECG of 24-Mar-2002 16:59, No significant change was found   RADIOLOGY Chest x-ray per radiology is negative for any acute cardiopulmonary process.  Ultrasound venous right lower extremity Ultrasound per radiology is negative for DVT but does show a 1.7 cm lipomatous lesion.   PROCEDURES:  Critical Care performed:   Procedures   MEDICATIONS ORDERED IN ED: Medications - No data to display  IMPRESSION / MDM / ASSESSMENT AND PLAN / ED COURSE  I reviewed the triage vital signs and the nursing notes.   Differential diagnosis includes, but is not limited to, DVT, cutaneous lesion, lipoma, hematoma.  68 year old male presents to the ED with complaint of right leg nodule and past history of DVT.  Patient currently is only taking aspirin  as he is allergic to Xarelto  and Eliquis .  He was taken off of the Coumadin  a long time ago.  On exam it is unlikely that he has a DVT and ultrasound was reassuring to him.  He was made aware that he does have a lipoma and can follow-up with his PCP if this continues to give him any problems or also see a surgeon for removal of this lesion.  Continue with his regular medication.      Patient's presentation is most consistent with acute presentation with potential threat to life or bodily function.  FINAL CLINICAL IMPRESSION(S) / ED DIAGNOSES   Final diagnoses:  Lipoma of right lower extremity     Rx / DC Orders   ED Discharge Orders     None        Note:  This document was prepared using  Dragon voice recognition software and may include unintentional dictation errors.   Saunders Shona CROME, PA-C 01/08/24 1445    Arlander Charleston, MD 01/08/24 [phone]

## 2024-01-08 NOTE — ED Notes (Signed)
 Nurse assessment: Nodule felt under skin on right thigh. Pt denies pain. Tingling in right foot present if patient pushing on the nodule. No redness or swelling at the site.

## 2024-01-08 NOTE — ED Triage Notes (Signed)
 Pt to ED from home with red swollen lump on his right thigh for one week. Pt has hx of DVT, does not take a blood thinner, denies SOB/CP.

## 2024-01-08 NOTE — Discharge Instructions (Signed)
 Follow-up with your primary care provider if any continued problems.  If the area on your leg continues to be sore or causing problems your primary care provider can refer you to a surgeon to have this removed.
